# Patient Record
Sex: Male | Born: 1945 | Race: Black or African American | Hispanic: No | Marital: Married | Smoking: Current every day smoker
Health system: Southern US, Community
[De-identification: ages and names within clinical notes are randomized; demographics above are authoritative.]

---

## 2014-07-12 ENCOUNTER — Telehealth: Payer: Self-pay | Admitting: *Deleted

## 2014-07-23 ENCOUNTER — Other Ambulatory Visit (HOSPITAL_COMMUNITY): Payer: Self-pay | Admitting: Internal Medicine

## 2014-07-23 ENCOUNTER — Ambulatory Visit (HOSPITAL_COMMUNITY)
Admission: RE | Admit: 2014-07-23 | Discharge: 2014-07-23 | Disposition: A | Discharge status: Home / Self Care | Payer: Medicare Other | Source: Ambulatory Visit | Attending: Internal Medicine | Admitting: Internal Medicine

## 2014-07-23 DIAGNOSIS — R079 Chest pain, unspecified: Secondary | ICD-10-CM

## 2014-07-23 DIAGNOSIS — R0602 Shortness of breath: Secondary | ICD-10-CM | POA: Diagnosis not present

## 2014-07-23 DIAGNOSIS — R51 Headache: Secondary | ICD-10-CM | POA: Insufficient documentation

## 2014-07-23 DIAGNOSIS — F17210 Nicotine dependence, cigarettes, uncomplicated: Secondary | ICD-10-CM | POA: Insufficient documentation

## 2014-08-02 ENCOUNTER — Ambulatory Visit (HOSPITAL_COMMUNITY)
Admission: RE | Admit: 2014-08-02 | Discharge: 2014-08-02 | Disposition: A | Discharge status: Home / Self Care | Payer: Medicare Other | Source: Ambulatory Visit | Attending: Internal Medicine | Admitting: Internal Medicine

## 2014-08-02 DIAGNOSIS — R079 Chest pain, unspecified: Secondary | ICD-10-CM | POA: Insufficient documentation

## 2014-08-02 DIAGNOSIS — R9431 Abnormal electrocardiogram [ECG] [EKG]: Secondary | ICD-10-CM | POA: Insufficient documentation

## 2014-09-30 NOTE — Telephone Encounter (Signed)
error 

## 2016-03-14 ENCOUNTER — Emergency Department (HOSPITAL_COMMUNITY)
Admission: EM | Admit: 2016-03-14 | Discharge: 2016-03-14 | Disposition: A | Discharge status: Home / Self Care | Payer: No Typology Code available for payment source | Attending: Physician Assistant | Admitting: Physician Assistant

## 2016-03-14 ENCOUNTER — Encounter (HOSPITAL_COMMUNITY): Payer: Self-pay

## 2016-03-14 DIAGNOSIS — M545 Low back pain, unspecified: Secondary | ICD-10-CM

## 2016-03-14 DIAGNOSIS — Y92410 Unspecified street and highway as the place of occurrence of the external cause: Secondary | ICD-10-CM | POA: Insufficient documentation

## 2016-03-14 DIAGNOSIS — M542 Cervicalgia: Secondary | ICD-10-CM | POA: Diagnosis not present

## 2016-03-14 DIAGNOSIS — Y999 Unspecified external cause status: Secondary | ICD-10-CM | POA: Diagnosis not present

## 2016-03-14 DIAGNOSIS — F17210 Nicotine dependence, cigarettes, uncomplicated: Secondary | ICD-10-CM | POA: Insufficient documentation

## 2016-03-14 DIAGNOSIS — Y939 Activity, unspecified: Secondary | ICD-10-CM | POA: Insufficient documentation

## 2016-03-14 DIAGNOSIS — S3992XA Unspecified injury of lower back, initial encounter: Secondary | ICD-10-CM | POA: Insufficient documentation

## 2016-03-14 MED ORDER — METHOCARBAMOL 500 MG PO TABS: 500.0000 mg | ORAL_TABLET | Freq: Two times a day (BID) | ORAL | 0 refills | Status: DC | PRN

## 2016-03-14 MED ORDER — NAPROXEN 250 MG PO TABS: 250.0000 mg | ORAL_TABLET | Freq: Two times a day (BID) | ORAL | 0 refills | Status: DC

## 2016-03-14 NOTE — ED Notes (Signed)
Declined W/C at D/C and was escorted to lobby by RN. 
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
## 2016-04-27 IMAGING — CR DG CHEST 2V
2 series · 2 of 2 positions shown · non-contrast
Comparison: None.

CLINICAL DATA: Shortness of breath in a smoker.

EXAM:
CHEST  2 VIEW

[w chest pa]
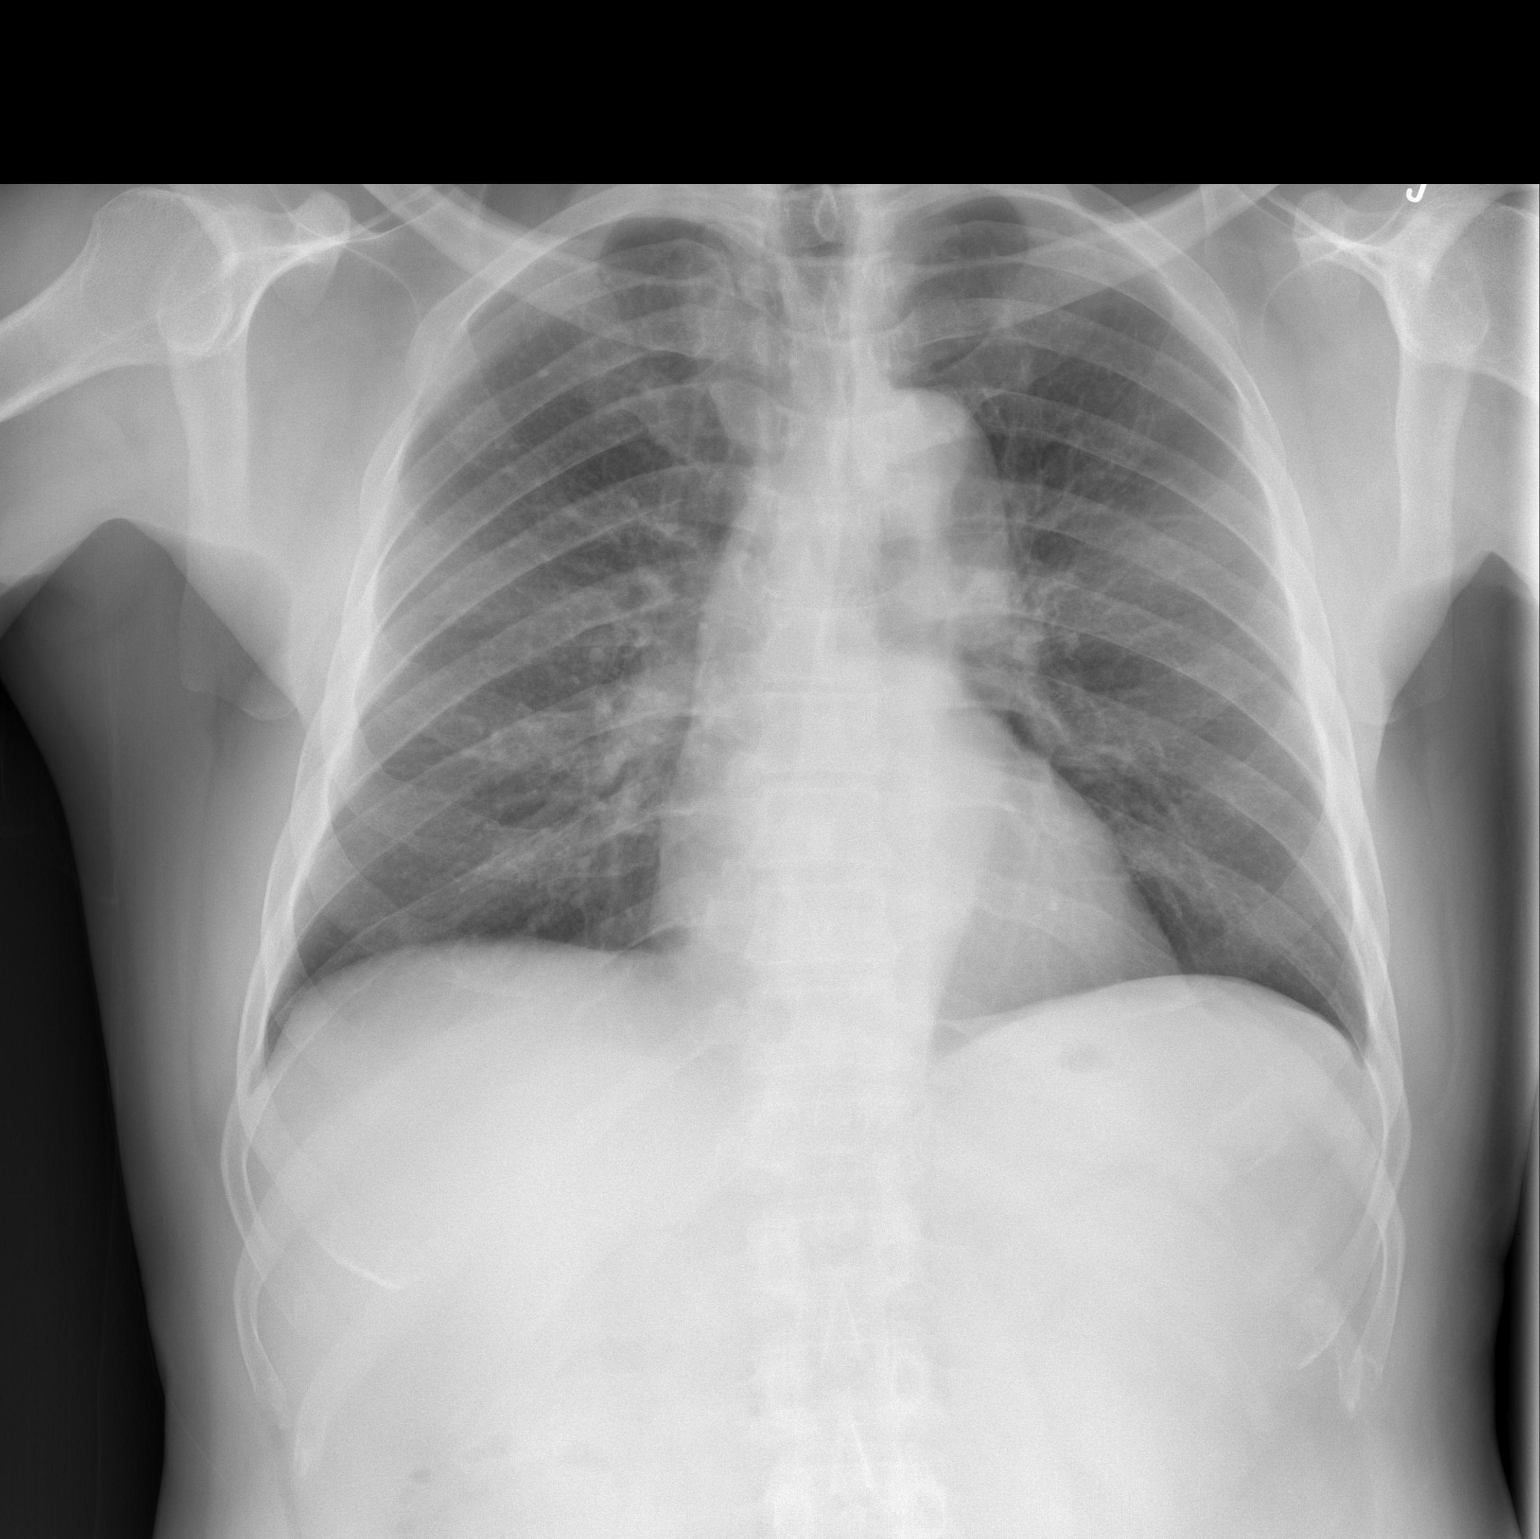

[w chest lat]
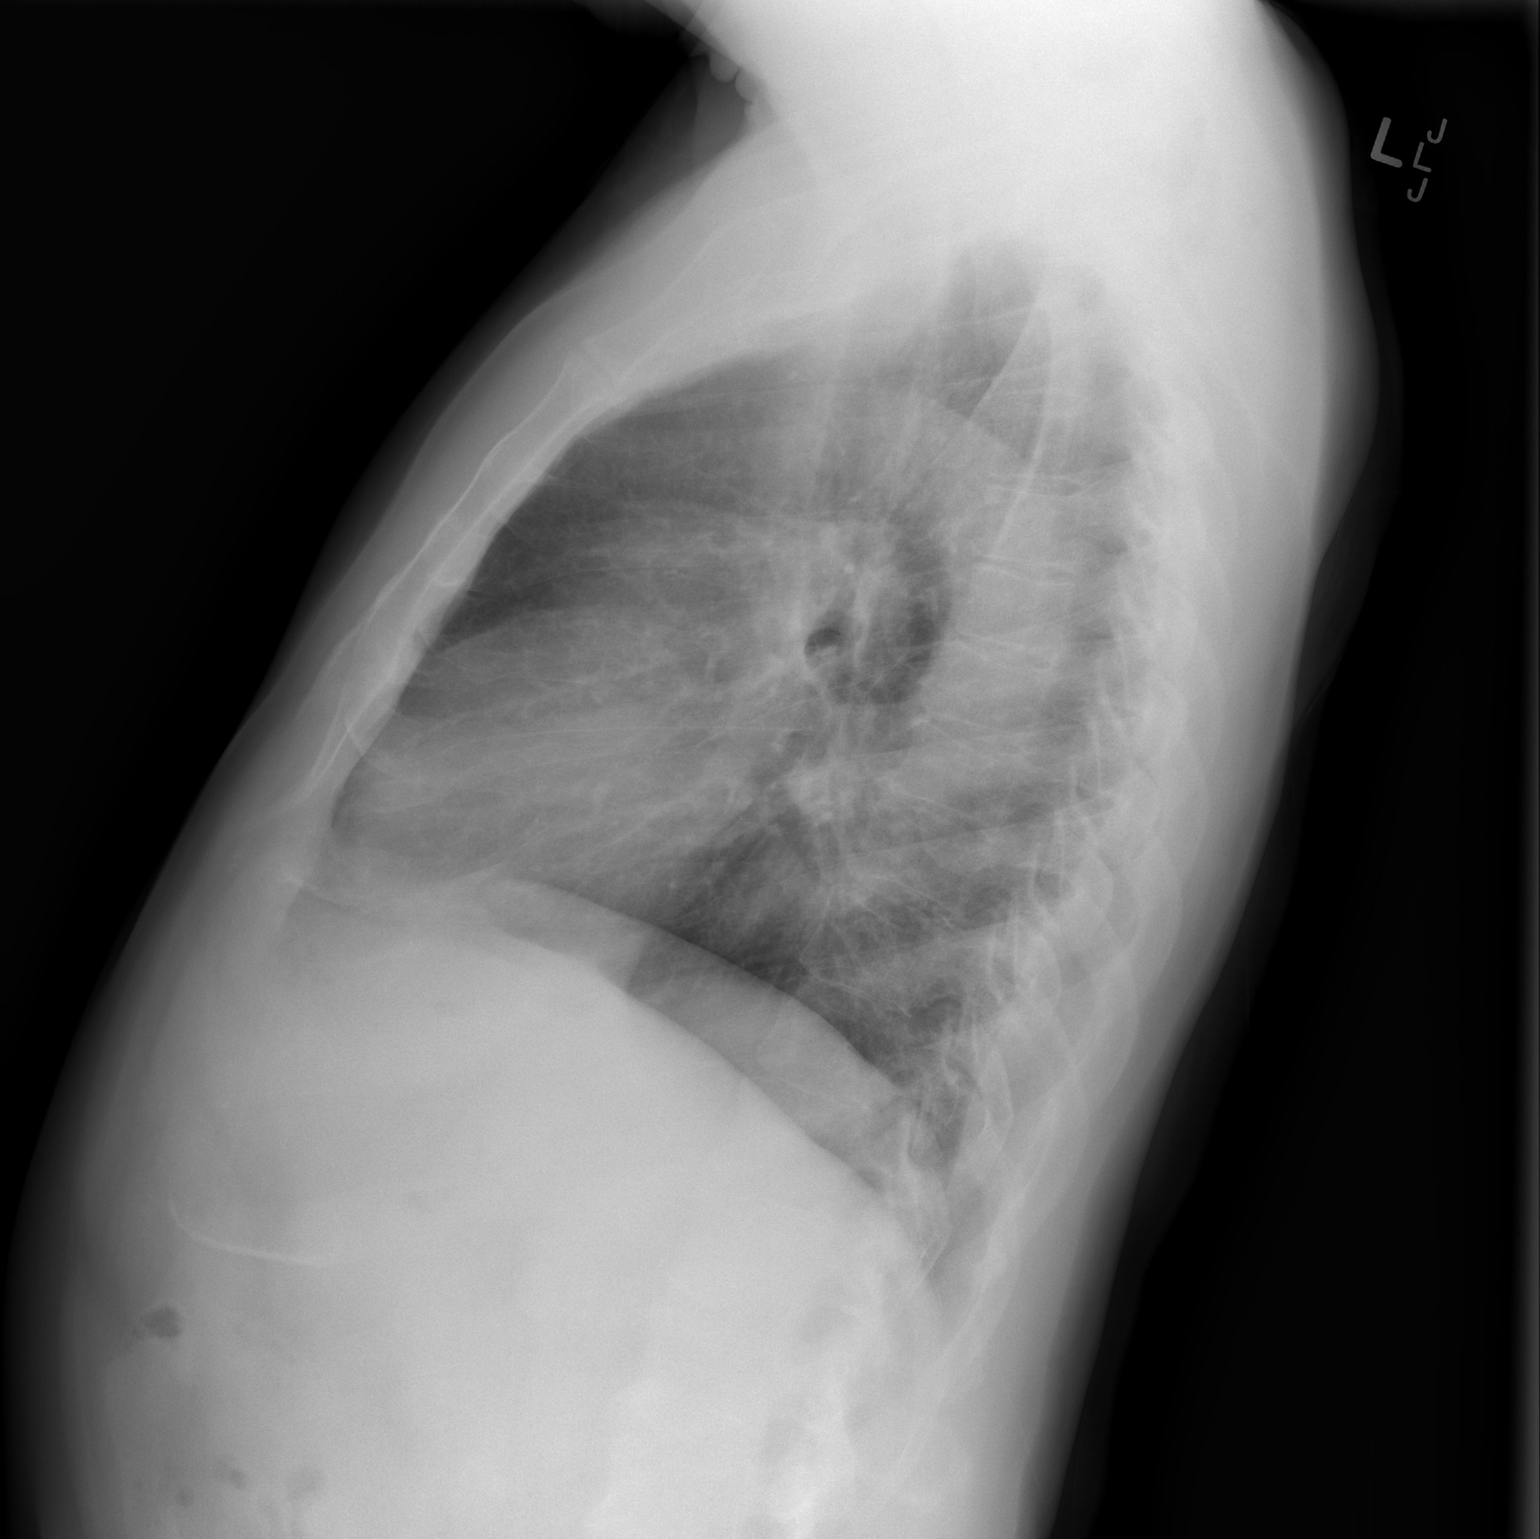

[2 of 2 positions shown; findings below may reference images not displayed]

FINDINGS: Normal heart size with tortuous aorta. Clear lung fields. No
effusion or pneumothorax. Unremarkable osseous structures.
IMPRESSION: No active disease.  BILATERAL 11 headache brighter diet scalp

## 2021-05-22 ENCOUNTER — Emergency Department (HOSPITAL_COMMUNITY)
Admission: EM | Admit: 2021-05-22 | Discharge: 2021-05-22 | Disposition: A | Discharge status: Home / Self Care | Payer: Medicare HMO | Attending: Emergency Medicine | Admitting: Emergency Medicine

## 2021-05-22 ENCOUNTER — Emergency Department (HOSPITAL_COMMUNITY): Payer: Medicare HMO

## 2021-05-22 ENCOUNTER — Other Ambulatory Visit: Payer: Self-pay

## 2021-05-22 ENCOUNTER — Encounter (HOSPITAL_COMMUNITY): Payer: Self-pay

## 2021-05-22 DIAGNOSIS — E876 Hypokalemia: Secondary | ICD-10-CM

## 2021-05-22 DIAGNOSIS — M5136 Other intervertebral disc degeneration, lumbar region: Secondary | ICD-10-CM | POA: Diagnosis not present

## 2021-05-22 DIAGNOSIS — R030 Elevated blood-pressure reading, without diagnosis of hypertension: Secondary | ICD-10-CM | POA: Diagnosis not present

## 2021-05-22 DIAGNOSIS — R200 Anesthesia of skin: Secondary | ICD-10-CM | POA: Diagnosis present

## 2021-05-22 DIAGNOSIS — M51369 Other intervertebral disc degeneration, lumbar region without mention of lumbar back pain or lower extremity pain: Secondary | ICD-10-CM

## 2021-05-22 LAB — CBC
HCT: 41.9 % (ref 39.0–52.0)
Hemoglobin: 13.4 g/dL (ref 13.0–17.0)
MCH: 26.3 pg (ref 26.0–34.0)
MCHC: 32 g/dL (ref 30.0–36.0)
MCV: 82.3 fL (ref 80.0–100.0)
Platelets: 266 10*3/uL (ref 150–400)
RBC: 5.09 MIL/uL (ref 4.22–5.81)
RDW: 15.9 % — ABNORMAL HIGH (ref 11.5–15.5)
WBC: 7.6 10*3/uL (ref 4.0–10.5)
nRBC: 0 % (ref 0.0–0.2)

## 2021-05-22 LAB — BASIC METABOLIC PANEL
Anion gap: 7 (ref 5–15)
BUN: 16 mg/dL (ref 8–23)
CO2: 28 mmol/L (ref 22–32)
Calcium: 9.1 mg/dL (ref 8.9–10.3)
Chloride: 105 mmol/L (ref 98–111)
Creatinine, Ser: 1.35 mg/dL — ABNORMAL HIGH (ref 0.61–1.24)
GFR, Estimated: 55 mL/min — ABNORMAL LOW (ref >60)
Glucose, Bld: 98 mg/dL (ref 70–99)
Potassium: 3.4 mmol/L — ABNORMAL LOW (ref 3.5–5.1)
Sodium: 140 mmol/L (ref 135–145)

## 2021-05-22 MED ORDER — POTASSIUM CHLORIDE CRYS ER 20 MEQ PO TBCR
40.0000 meq | EXTENDED_RELEASE_TABLET | Freq: Once | ORAL | Status: AC
  Administered 2021-05-22: 40 meq via ORAL
  Filled 2021-05-22: qty 2

## 2021-05-22 MED ORDER — PREDNISONE 20 MG PO TABS
60.0000 mg | ORAL_TABLET | Freq: Once | ORAL | Status: AC
  Administered 2021-05-22: 60 mg via ORAL
  Filled 2021-05-22: qty 3

## 2021-05-22 MED ORDER — PREDNISONE 20 MG PO TABS: ORAL_TABLET | ORAL | 0 refills | Status: DC

## 2021-05-22 NOTE — Discharge Instructions (Signed)
It was our pleasure to provide your ER care today - we hope that you feel better.  Take prednisone as prescribed. Take acetaminophen as need.   Follow up with primary care doctor in the coming week - discuss current symptoms, and follow up plan, also have your blood pressure rechecked as it is high today. Your back imaging/ct scan shows degenerative disc changes in your lower back - follow up with back/spine specialist in the next 1-2 weeks  - call office to arrange appointment.   From today's labs, your potassium level is slightly low  - eat plenty of fruits and vegetables, and follow up with your doctor.   Return to ER if worse, new symptoms, new/severe pain, weakness, loss of normal urine or bowel function, or other concern.

## 2021-05-22 NOTE — ED Provider Notes (Signed)
[facility] EMERGENCY DEPARTMENT Provider Note   CSN: [id number] Arrival date & time: 05/22/21  1709     History  Chief Complaint  Patient presents with   Leg Tingling    Kenneth Benitez is a 76 y.o. male.  Patient c/o bilateral leg 'numbness/tingling' sensation in past 3-4 weeks. Symptoms gradual onset, constant, persistent, no acute or abrupt worsening today. Went to pcp office today and they sent to ED via EMS - pt indicates walked into office just fine, denies weakness. No arm numbness/tingling. Denies trauma/fall. Denies hx ddd. No radicular pain. States some recent light sensitivity. Denies headache. No change in speech or vision. No loss of normal functional ability. No problems w balance/walking. No problems with normal bowel and bladder function. No saddle area numbness.   The history is provided by the patient, medical records and the EMS personnel.      Home Medications Prior to Admission medications   Medication Sig Start Date End Date Taking? Authorizing Provider  methocarbamol (ROBAXIN) 500 MG tablet Take 1 tablet (500 mg total) by mouth 2 (two) times daily as needed for muscle spasms. 03/14/16   Waynetta Pean, PA-C  naproxen (NAPROSYN) 250 MG tablet Take 1 tablet (250 mg total) by mouth 2 (two) times daily with a meal. 03/14/16   Waynetta Pean, PA-C      Allergies    Patient has no known allergies.    Review of Systems   Review of Systems  Constitutional:  Negative for chills and fever.  HENT:  Negative for sore throat.   Eyes:  Negative for pain, redness and visual disturbance.  Respiratory:  Negative for cough and shortness of breath.   Cardiovascular:  Negative for chest pain.  Gastrointestinal:  Negative for abdominal pain, diarrhea and vomiting.  Genitourinary:  Negative for difficulty urinating, dysuria and flank pain.  Musculoskeletal:  Negative for back pain and neck pain.  Skin:  Negative for rash.  Neurological:  Positive for  numbness. Negative for weakness and headaches.  Hematological:  Does not bruise/bleed easily.  Psychiatric/Behavioral:  Negative for confusion.    Physical Exam Updated Vital Signs Pulse 76    Temp 98.1 F (36.7 C) (Oral)    Resp (!) 21    SpO2 99%  Physical Exam Vitals and nursing note reviewed.  Constitutional:      Appearance: Normal appearance. He is well-developed.  HENT:     Head: Atraumatic.     Nose: Nose normal.     Mouth/Throat:     Mouth: Mucous membranes are moist.     Pharynx: Oropharynx is clear.  Eyes:     General: No scleral icterus.    Conjunctiva/sclera: Conjunctivae normal.     Pupils: Pupils are equal, round, and reactive to light.  Neck:     Vascular: No carotid bruit.     Trachea: No tracheal deviation.  Cardiovascular:     Rate and Rhythm: Normal rate and regular rhythm.     Pulses: Normal pulses.     Heart sounds: Normal heart sounds. No murmur heard.   No friction rub. No gallop.  Pulmonary:     Effort: Pulmonary effort is normal. No accessory muscle usage or respiratory distress.     Breath sounds: Normal breath sounds.  Abdominal:     General: Bowel sounds are normal. There is no distension.     Palpations: Abdomen is soft.     Tenderness: There is no abdominal tenderness. There is no guarding.  Genitourinary:    Comments: No cva tenderness. Musculoskeletal:        General: No swelling.     Cervical back: Normal range of motion and neck supple. No rigidity.     Right lower leg: No edema.     Left lower leg: No edema.     Comments: CTLS spine, non tender, aligned, no step off. No lower extremity swelling. Distal pulses palp bil.   Skin:    General: Skin is warm and dry.     Findings: No rash.  Neurological:     Mental Status: He is alert.     Comments: Alert, speech clear.   Psychiatric:        Mood and Affect: Mood normal.    ED Results / Procedures / Treatments   Labs (all labs ordered are listed, but only abnormal results are  displayed) Results for orders placed or performed during the hospital encounter of [id number]  Basic metabolic panel  Result Value Ref Range   Sodium 140 135 - 145 mmol/L   Potassium 3.4 (L) 3.5 - 5.1 mmol/L   Chloride 105 98 - 111 mmol/L   CO2 28 22 - 32 mmol/L   Glucose, Bld 98 70 - 99 mg/dL   BUN 16 8 - 23 mg/dL   Creatinine, Ser 1.35 (H) 0.61 - 1.24 mg/dL   Calcium 9.1 8.9 - 10.3 mg/dL   GFR, Estimated 55 (L) >60 mL/min   Anion gap 7 5 - 15  CBC  Result Value Ref Range   WBC 7.6 4.0 - 10.5 K/uL   RBC 5.09 4.22 - 5.81 MIL/uL   Hemoglobin 13.4 13.0 - 17.0 g/dL   HCT 41.9 39.0 - 52.0 %   MCV 82.3 80.0 - 100.0 fL   MCH 26.3 26.0 - 34.0 pg   MCHC 32.0 30.0 - 36.0 g/dL   RDW 15.9 (H) 11.5 - 15.5 %   Platelets 266 150 - 400 K/uL   nRBC 0.0 0.0 - 0.2 %   No results found.   EKG None  Radiology CT Head Wo Contrast  Result Date: 05/22/2021 CLINICAL DATA:  Paresthesias, tingling and numbness EXAM: CT HEAD WITHOUT CONTRAST TECHNIQUE: Contiguous axial images were obtained from the base of the skull through the vertex without intravenous contrast. RADIATION DOSE REDUCTION: This exam was performed according to the departmental dose-optimization program which includes automated exposure control, adjustment of the mA and/or kV according to patient size and/or use of iterative reconstruction technique. COMPARISON:  None. FINDINGS: Brain: No acute intracranial findings are seen. There are no signs of bleeding or any focal mass effect. Cortical sulci are prominent. There is decreased density in the periventricular and subcortical white matter. Vascular: Unremarkable. Skull: No fracture is seen. Sinuses/Orbits: There is opacification of left maxillary sinus. There is medial bulging of medial wall of left maxillary sinus. Other: None IMPRESSION: No acute intracranial findings are seen in noncontrast CT brain. Atrophy. Small-vessel disease. There is opacification of left maxillary sinus suggesting  chronic sinusitis. There is medial bulging of medial wall of left maxillary sinus which may be residual from previous trauma or suggest mucocele. Electronically Signed   By: Elmer Picker M.D.   On: 05/22/2021 18:34   CT Lumbar Spine Wo Contrast  Result Date: 05/22/2021 CLINICAL DATA:  Lumbar radiculopathy. Previous surgery. New symptoms. Tingling and numbness both legs over the last 3 weeks. EXAM: CT LUMBAR SPINE WITHOUT CONTRAST TECHNIQUE: Multidetector CT imaging of the lumbar spine was performed without intravenous contrast  administration. Multiplanar CT image reconstructions were also generated. RADIATION DOSE REDUCTION: This exam was performed according to the departmental dose-optimization program which includes automated exposure control, adjustment of the mA and/or kV according to patient size and/or use of iterative reconstruction technique. COMPARISON:  None. FINDINGS: Segmentation: 5 lumbar type vertebral bodies. Alignment: No malalignment. Straightening of the normal cervical lordosis. Vertebrae: No fracture or focal bone lesion. Incidental endplate Schmorl's nodes. Paraspinal and other soft tissues: Small hiatal hernia. Aortic atherosclerotic calcification. Disc levels: Mild non-compressive disc bulges from T10-11 through L3-4. L4-5: Moderate bulging of the disc. Facet and ligamentous hypertrophy. Stenosis of both lateral recesses that could possibly cause neural compression. L5-S1: Bulging of the disc with encroachment upon both neural foramina. Mild facet degeneration and hypertrophy. No central canal stenosis. Either L5 nerve could be affected, more likely the left. IMPRESSION: L4-5: Disc bulge. Facet and ligamentous hypertrophy. Narrowing of both subarticular lateral recesses that could possibly cause neural compression. L5-S1: Disc bulge and endplate osteophytes. Bilateral foraminal encroachment left more than right. Either L5 nerve could be affected, more likely the left. Electronically  Signed   By: Nelson Chimes M.D.   On: 05/22/2021 18:34    Procedures Procedures    Medications Ordered in ED Medications - No data to display  ED Course/ Medical Decision Making/ A&P                           Medical Decision Making Problems Addressed: Bilateral leg numbness: chronic illness or injury Elevated blood pressure reading: acute illness or injury Hypokalemia: acute illness or injury Lumbar degenerative disc disease: chronic illness or injury with exacerbation, progression, or side effects of treatment  Amount and/or Complexity of Data Reviewed Independent Historian:     Details: clinic notes from today External Data Reviewed: ECG and notes. Labs: ordered. Decision-making details documented in ED Course. Radiology: ordered and independent interpretation performed. Decision-making details documented in ED Course.  Risk Prescription drug management.   Imaging ordered.  Reviewed nursing notes and prior charts for additional history. External reports reviewed.   Labs reviewed/interpreted by me - k sl low. Kcl po.  CT reviewed/interpreted by me - ddd, no critical/cord compression.   Pt denies any weakness, no gi or gu c/o. Symptoms ongoing for several weeks without abrupt worsening.   Will try pred taper, rec pcp and outpatient spine f/u.  Return precautions provided.             Final Clinical Impression(s) / ED Diagnoses Final diagnoses:  None    Rx / DC Orders ED Discharge Orders     None         Lajean Saver, MD 05/22/21 2043

## 2021-05-22 NOTE — ED Notes (Signed)
Got patient undressed on the monitor did ekg shown to er provider patient is resitng with call bell in reach

## 2021-05-22 NOTE — ED Notes (Signed)
Discharge instructions reviewed with patient. Patient verbalized understanding of instructions. Follow-up care and medications were reviewed. Patient ambulatory with steady gait. VSS upon discharge.  ?

## 2021-05-22 NOTE — ED Triage Notes (Signed)
Pt arrived via EMS from PCP. Pt has had bilateral leg tingling x 3 weeks w/ a heaviness feeling in them. Pt also c/o lights at night being bright. VSS w/ EMS.

## 2021-12-18 ENCOUNTER — Emergency Department (HOSPITAL_COMMUNITY): Payer: Medicare Other

## 2021-12-18 ENCOUNTER — Inpatient Hospital Stay (HOSPITAL_COMMUNITY)
Admission: EM | Admit: 2021-12-18 | Discharge: 2021-12-22 | DRG: 177 | Disposition: A | Discharge status: Skilled Nursing Facility | Payer: Medicare Other | Attending: Internal Medicine | Admitting: Internal Medicine

## 2021-12-18 ENCOUNTER — Encounter (HOSPITAL_COMMUNITY): Payer: Self-pay | Admitting: Emergency Medicine

## 2021-12-18 DIAGNOSIS — Z20822 Contact with and (suspected) exposure to covid-19: Secondary | ICD-10-CM | POA: Diagnosis present

## 2021-12-18 DIAGNOSIS — J189 Pneumonia, unspecified organism: Secondary | ICD-10-CM | POA: Diagnosis present

## 2021-12-18 DIAGNOSIS — R600 Localized edema: Secondary | ICD-10-CM | POA: Diagnosis present

## 2021-12-18 DIAGNOSIS — E440 Moderate protein-calorie malnutrition: Secondary | ICD-10-CM | POA: Diagnosis present

## 2021-12-18 DIAGNOSIS — E876 Hypokalemia: Secondary | ICD-10-CM | POA: Diagnosis present

## 2021-12-18 DIAGNOSIS — I129 Hypertensive chronic kidney disease with stage 1 through stage 4 chronic kidney disease, or unspecified chronic kidney disease: Secondary | ICD-10-CM | POA: Diagnosis present

## 2021-12-18 DIAGNOSIS — R4189 Other symptoms and signs involving cognitive functions and awareness: Secondary | ICD-10-CM | POA: Diagnosis present

## 2021-12-18 DIAGNOSIS — Z87891 Personal history of nicotine dependence: Secondary | ICD-10-CM

## 2021-12-18 DIAGNOSIS — F321 Major depressive disorder, single episode, moderate: Secondary | ICD-10-CM | POA: Diagnosis present

## 2021-12-18 DIAGNOSIS — G929 Unspecified toxic encephalopathy: Secondary | ICD-10-CM | POA: Diagnosis present

## 2021-12-18 DIAGNOSIS — I161 Hypertensive emergency: Secondary | ICD-10-CM | POA: Diagnosis present

## 2021-12-18 DIAGNOSIS — Z79899 Other long term (current) drug therapy: Secondary | ICD-10-CM

## 2021-12-18 DIAGNOSIS — I10 Essential (primary) hypertension: Secondary | ICD-10-CM | POA: Diagnosis not present

## 2021-12-18 DIAGNOSIS — I160 Hypertensive urgency: Secondary | ICD-10-CM | POA: Diagnosis present

## 2021-12-18 DIAGNOSIS — J690 Pneumonitis due to inhalation of food and vomit: Secondary | ICD-10-CM | POA: Diagnosis not present

## 2021-12-18 DIAGNOSIS — I5031 Acute diastolic (congestive) heart failure: Secondary | ICD-10-CM | POA: Diagnosis not present

## 2021-12-18 DIAGNOSIS — Z6825 Body mass index (BMI) 25.0-25.9, adult: Secondary | ICD-10-CM

## 2021-12-18 DIAGNOSIS — R627 Adult failure to thrive: Secondary | ICD-10-CM | POA: Diagnosis present

## 2021-12-18 DIAGNOSIS — R4182 Altered mental status, unspecified: Principal | ICD-10-CM

## 2021-12-18 DIAGNOSIS — R5381 Other malaise: Secondary | ICD-10-CM | POA: Diagnosis not present

## 2021-12-18 DIAGNOSIS — R9431 Abnormal electrocardiogram [ECG] [EKG]: Secondary | ICD-10-CM | POA: Diagnosis not present

## 2021-12-18 DIAGNOSIS — N1831 Chronic kidney disease, stage 3a: Secondary | ICD-10-CM | POA: Diagnosis present

## 2021-12-18 LAB — URINALYSIS, ROUTINE W REFLEX MICROSCOPIC
Bilirubin Urine: NEGATIVE
Glucose, UA: NEGATIVE mg/dL
Hgb urine dipstick: NEGATIVE
Ketones, ur: 5 mg/dL — AB
Nitrite: NEGATIVE
Protein, ur: 30 mg/dL — AB
Specific Gravity, Urine: 1.024 (ref 1.005–1.030)
pH: 5 (ref 5.0–8.0)

## 2021-12-18 LAB — CBC WITH DIFFERENTIAL/PLATELET
Abs Immature Granulocytes: 0.06 10*3/uL (ref 0.00–0.07)
Basophils Absolute: 0.1 10*3/uL (ref 0.0–0.1)
Basophils Relative: 0 %
Eosinophils Absolute: 0.4 10*3/uL (ref 0.0–0.5)
Eosinophils Relative: 3 %
HCT: 43.2 % (ref 39.0–52.0)
Hemoglobin: 13.7 g/dL (ref 13.0–17.0)
Immature Granulocytes: 1 %
Lymphocytes Relative: 16 %
Lymphs Abs: 2.1 10*3/uL (ref 0.7–4.0)
MCH: 26.2 pg (ref 26.0–34.0)
MCHC: 31.7 g/dL (ref 30.0–36.0)
MCV: 82.6 fL (ref 80.0–100.0)
Monocytes Absolute: 1.1 10*3/uL — ABNORMAL HIGH (ref 0.1–1.0)
Monocytes Relative: 9 %
Neutro Abs: 9.2 10*3/uL — ABNORMAL HIGH (ref 1.7–7.7)
Neutrophils Relative %: 71 %
Platelets: 246 10*3/uL (ref 150–400)
RBC: 5.23 MIL/uL (ref 4.22–5.81)
RDW: 16.1 % — ABNORMAL HIGH (ref 11.5–15.5)
WBC: 12.9 10*3/uL — ABNORMAL HIGH (ref 4.0–10.5)
nRBC: 0 % (ref 0.0–0.2)

## 2021-12-18 LAB — RESP PANEL BY RT-PCR (FLU A&B, COVID) ARPGX2
Influenza A by PCR: NEGATIVE
Influenza B by PCR: NEGATIVE
SARS Coronavirus 2 by RT PCR: NEGATIVE

## 2021-12-18 LAB — COMPREHENSIVE METABOLIC PANEL
ALT: 23 U/L (ref 0–44)
AST: 24 U/L (ref 15–41)
Albumin: 4.1 g/dL (ref 3.5–5.0)
Alkaline Phosphatase: 78 U/L (ref 38–126)
Anion gap: 11 (ref 5–15)
BUN: 12 mg/dL (ref 8–23)
CO2: 27 mmol/L (ref 22–32)
Calcium: 9.9 mg/dL (ref 8.9–10.3)
Chloride: 105 mmol/L (ref 98–111)
Creatinine, Ser: 1.42 mg/dL — ABNORMAL HIGH (ref 0.61–1.24)
GFR, Estimated: 52 mL/min — ABNORMAL LOW (ref >60)
Glucose, Bld: 79 mg/dL (ref 70–99)
Potassium: 3 mmol/L — ABNORMAL LOW (ref 3.5–5.1)
Sodium: 143 mmol/L (ref 135–145)
Total Bilirubin: 0.4 mg/dL (ref 0.3–1.2)
Total Protein: 7.9 g/dL (ref 6.5–8.1)

## 2021-12-18 LAB — MAGNESIUM: Magnesium: 2 mg/dL (ref 1.7–2.4)

## 2021-12-18 LAB — TROPONIN I (HIGH SENSITIVITY)
Troponin I (High Sensitivity): 8 ng/L (ref <18)
Troponin I (High Sensitivity): 8 ng/L (ref <18)

## 2021-12-18 LAB — LACTIC ACID, PLASMA: Lactic Acid, Venous: 1.2 mmol/L (ref 0.5–1.9)

## 2021-12-18 LAB — CBG MONITORING, ED: Glucose-Capillary: 78 mg/dL (ref 70–99)

## 2021-12-18 LAB — AMMONIA: Ammonia: 19 umol/L (ref 9–35)

## 2021-12-18 LAB — BRAIN NATRIURETIC PEPTIDE: B Natriuretic Peptide: 48.1 pg/mL (ref 0.0–100.0)

## 2021-12-18 MED ORDER — IOHEXOL 350 MG/ML SOLN
100.0000 mL | Freq: Once | INTRAVENOUS | Status: AC | PRN
  Administered 2021-12-18: 100 mL via INTRAVENOUS

## 2021-12-18 MED ORDER — HYDRALAZINE HCL 20 MG/ML IJ SOLN: 5.0000 mg | Freq: Four times a day (QID) | INTRAMUSCULAR | Status: DC | PRN

## 2021-12-18 MED ORDER — SODIUM CHLORIDE 0.9 % IV SOLN
3.0000 g | Freq: Four times a day (QID) | INTRAVENOUS | Status: DC
  Administered 2021-12-18 – 2021-12-22 (×16): 3 g via INTRAVENOUS
  Filled 2021-12-18 (×14): qty 8

## 2021-12-18 MED ORDER — POLYETHYLENE GLYCOL 3350 17 G PO PACK: 17.0000 g | PACK | Freq: Every day | ORAL | Status: DC | PRN

## 2021-12-18 MED ORDER — AMLODIPINE BESYLATE 5 MG PO TABS
5.0000 mg | ORAL_TABLET | Freq: Every day | ORAL | Status: DC
Start: 2021-12-18 — End: 2021-12-22
  Administered 2021-12-18 – 2021-12-22 (×5): 5 mg via ORAL
  Filled 2021-12-18 (×5): qty 1

## 2021-12-18 MED ORDER — SODIUM CHLORIDE 0.9 % IV SOLN
3.0000 g | Freq: Once | INTRAVENOUS | Status: DC
  Filled 2021-12-18: qty 8

## 2021-12-18 MED ORDER — ONDANSETRON HCL 4 MG/2ML IJ SOLN: 4.0000 mg | Freq: Four times a day (QID) | INTRAMUSCULAR | Status: DC | PRN

## 2021-12-18 MED ORDER — POTASSIUM CHLORIDE 10 MEQ/100ML IV SOLN
10.0000 meq | INTRAVENOUS | Status: AC
  Administered 2021-12-18 – 2021-12-19 (×3): 10 meq via INTRAVENOUS
  Filled 2021-12-18 (×3): qty 100

## 2021-12-18 MED ORDER — ENOXAPARIN SODIUM 40 MG/0.4ML IJ SOSY
40.0000 mg | PREFILLED_SYRINGE | INTRAMUSCULAR | Status: DC
  Administered 2021-12-18 – 2021-12-21 (×4): 40 mg via SUBCUTANEOUS
  Filled 2021-12-18 (×4): qty 0.4

## 2021-12-18 MED ORDER — ACETAMINOPHEN 325 MG PO TABS: 650.0000 mg | ORAL_TABLET | Freq: Four times a day (QID) | ORAL | Status: DC | PRN

## 2021-12-18 NOTE — ED Provider Notes (Signed)
Shared resident visit, history, physical, medical decision performed by myself.  Patient here with altered mental status.  Over the last several weeks patient with increasing confusion, failure to thrive, now unable to walk under his own power.  He has been having significant leg swelling the last week or so as well.  Lives at home with a family member who is unable to safely take care of him.  He has been having elevated blood pressures here recently and saw a doctor at the [place] but they have not started any medications for this.  He is not taking any medications.  There has not been any fever but there has been a cough.  Patient answers questions well but he is possibly mildly encephalopathic.  He is got pitting edema bilaterally in his legs.  But he does not show any signs of respiratory distress or hypoxia.  This could be venous stasis versus volume overloaded.  Differential is wide including press, infectious process, stroke, electrolyte abnormality.  Blood work including CBC, CMP, viral panel, magnesium, Trope, BNP, chest x-ray, ammonia, urinalysis, lactic acid has been ordered.  He is not febrile and no concern for sepsis and can hold off on broad-spectrum IV antibiotics and blood cultures at this time.  We will get head CT.  Per my review and interpretation of labs thus far he has a white count of 12.9.  Chest x-ray concerning for pneumonia per radiology report.  Creatinine appears to be at baseline at 1.42.  Potassium is 3.0.  We will add CT scan of the chest given abnormal chest x-ray to further evaluate for infectious process.  CT report per radiology appears consistent with pneumonia likely aspiration.  Given his mild encephalopathy with this and overall failure to thrive will admit for IV antibiotics and likely supportive care may be placement.  Will admit to medicine.  This chart was dictated using voice recognition software.  Despite best efforts to proofread,  errors can occur which can change  the documentation meaning.    Virgina Norfolk, DO 12/18/21 1929

## 2021-12-18 NOTE — ED Provider Notes (Signed)
[facility] EMERGENCY DEPARTMENT Provider Note   CSN: [id number] Arrival date & time: 12/18/21  1136     History  Chief Complaint  Patient presents with   Altered Mental Status    WARDEN BUFFA is a 76 y.o. male with Hx of HTN and chronic daily tobacco use.  Presenting today due to gradually worsening mental status over the last several months, with significant decrease within the last few weeks.  Patient's sister states he is no longer able to care for himself.  She continues that he seems to lose thought during the middle of his answers, or answers different questions and than have been asked.  Also has developed a cough and new lower extremity swelling within the last 3 to 4 days.  Cough is nonproductive, causes him increasing fatigue.  Also gradually unable to walk well without assistance, as a few months ago he was able to walk without issue.  Denies fevers, sore throat, chest pain, N/V/D, constipation, abdominal pain, changes in bowel habits, neck stiffness, vision changes.  Used to be on hypertensive medications a few years ago, however has stopped taking these.  Seen at the [facility] earlier today with a chest x-ray done, then was taken here to [facility] ED due to the PA's slow progress and updates, per patient's sister.  The history is provided by the patient and medical records.  Altered Mental Status Associated symptoms: weakness        Home Medications Prior to Admission medications   Medication Sig Start Date End Date Taking? Authorizing Provider  methocarbamol (ROBAXIN) 500 MG tablet Take 1 tablet (500 mg total) by mouth 2 (two) times daily as needed for muscle spasms. 03/14/16   Everlene Farrier, PA-C  naproxen (NAPROSYN) 250 MG tablet Take 1 tablet (250 mg total) by mouth 2 (two) times daily with a meal. 03/14/16   Everlene Farrier, PA-C  predniSONE (DELTASONE) 20 MG tablet 3 po once a day for 2 days, then 2 po once a day for 3 days, then 1 po once  a day for 3 days 05/23/21   Cathren Laine, MD      Allergies    Patient has no known allergies.    Review of Systems   Review of Systems  Respiratory:  Positive for cough.   Neurological:  Positive for weakness.    Physical Exam Updated Vital Signs BP (!) 165/105   Pulse 81   Temp 98.3 F (36.8 C) (Oral)   Resp 16   SpO2 97%  Physical Exam Vitals and nursing note reviewed.  Constitutional:      General: He is not in acute distress.    Appearance: He is well-developed. He is ill-appearing. He is not toxic-appearing or diaphoretic.  HENT:     Head: Normocephalic and atraumatic.     Mouth/Throat:     Pharynx: Oropharynx is clear. No oropharyngeal exudate or posterior oropharyngeal erythema.  Eyes:     Conjunctiva/sclera: Conjunctivae normal.  Neck:     Comments: No meningismus or torticollis Cardiovascular:     Rate and Rhythm: Normal rate and regular rhythm.     Pulses: Normal pulses.     Heart sounds: No murmur heard.    Comments: 1+ pulses of DP and PT, 2+ radial Pulmonary:     Effort: Pulmonary effort is normal. No respiratory distress.     Breath sounds: No stridor. Wheezing present.  Chest:     Chest wall: No tenderness.  Abdominal:  General: There is no distension.     Palpations: Abdomen is soft.     Tenderness: There is no abdominal tenderness. There is no right CVA tenderness, left CVA tenderness or guarding.  Musculoskeletal:        General: No swelling.     Cervical back: Neck supple. No rigidity.     Right lower leg: Edema present.     Left lower leg: Edema present.     Comments: 2+ to 3+ pitting edema of lower extremities to just above the knee.  Sensation and blood flow appear grossly intact.  Skin:    General: Skin is warm and dry.     Capillary Refill: Capillary refill takes less than 2 seconds.     Coloration: Skin is not jaundiced.  Neurological:     Mental Status: He is alert and oriented to person, place, and time.  Psychiatric:         Mood and Affect: Mood normal.     ED Results / Procedures / Treatments   Labs (all labs ordered are listed, but only abnormal results are displayed) Labs Reviewed  COMPREHENSIVE METABOLIC PANEL - Abnormal; Notable for the following components:      Result Value   Potassium 3.0 (*)    Creatinine, Ser 1.42 (*)    GFR, Estimated 52 (*)    All other components within normal limits  CBC WITH DIFFERENTIAL/PLATELET - Abnormal; Notable for the following components:   WBC 12.9 (*)    RDW 16.1 (*)    Neutro Abs 9.2 (*)    Monocytes Absolute 1.1 (*)    All other components within normal limits  RESP PANEL BY RT-PCR (FLU A&B, COVID) ARPGX2  URINE CULTURE  MAGNESIUM  URINALYSIS, ROUTINE W REFLEX MICROSCOPIC  BRAIN NATRIURETIC PEPTIDE  AMMONIA  LACTIC ACID, PLASMA  LACTIC ACID, PLASMA  CBG MONITORING, ED  TROPONIN I (HIGH SENSITIVITY)    EKG EKG Interpretation  Date/Time:  Friday December 18 2021 12:12:30 EDT Ventricular Rate:  84 PR Interval:  152 QRS Duration: 78 QT Interval:  346 QTC Calculation: 408 R Axis:   58 Text Interpretation: Sinus rhythm with Premature supraventricular complexes and with occasional Premature ventricular complexes Nonspecific ST and T wave abnormality Abnormal ECG When compared with ECG of 22-May-2021 17:16, PREVIOUS ECG IS PRESENT Confirmed by Virgina Norfolk ([id number]) on 12/18/2021 5:40:12 PM  Radiology DG Chest 2 View  Result Date: 12/18/2021 CLINICAL DATA:  Cough EXAM: CHEST - 2 VIEW COMPARISON:  07/23/2014 FINDINGS: Cardiac size is within normal limits. There are no signs of alveolar pulmonary edema. There is prominence of right hilum. Increased markings are seen in medial right lower lung field. Rest of the lung fields are clear. Left hemidiaphragm is slightly elevated. There is no pleural effusion or pneumothorax. IMPRESSION: Increased markings are seen in medial right lower lung fields suggesting atelectasis/pneumonia. Right hilum is more prominent on the  left which may be due to adjacent infiltrate or enlarged lymph nodes. Short-term follow-up chest radiographs along with CT if warranted should be considered to rule out any underlying neoplastic process. Electronically Signed   By: Ernie Avena M.D.   On: 12/18/2021 13:24    Procedures Procedures    Medications Ordered in ED Medications  iohexol (OMNIPAQUE) 350 MG/ML injection 100 mL (100 mLs Intravenous Contrast Given 12/18/21 1840)    ED Course/ Medical Decision Making/ A&P  Medical Decision Making Amount and/or Complexity of Data Reviewed Labs: ordered. Radiology: ordered.  Risk Prescription drug management.   76 y.o. male presents to the ED for concern of Altered Mental Status     This involves an extensive number of treatment options, and is a complaint that carries with it a high risk of complications and morbidity.  The emergent differential diagnosis prior to evaluation includes, but is not limited to: Press syndrome, pneumonia, failure to thrive, hypertensive emergency, new onset dementia  This is not an exhaustive differential.   Past Medical History / Co-morbidities / Social History: Hx of HTN and chronic daily tobacco use Social Determinants of Health include: Elderly, decreased independence and able to take care of oneself  Additional History:  Obtained by chart review.  Notably Hx of HTN  Lab Tests: I ordered, and personally interpreted labs.  The pertinent results include:   Magnesium 2.0 WBC 12.9 with leukocytosis Potassium 3.0 Creatinine 1.42, BUN 12, GFR 52, appears mildly stable CBG: 78 Covid and flu negative  Imaging Studies: I ordered imaging studies including CXR, CT head and CT chest.   I independently visualized and interpreted imaging which showed CXR: possible pneumonia vs atelectasis vs mass CT head and neck: Pending I agree with the radiologist interpretation.  Cardiac Monitoring: The patient was maintained  on a cardiac monitor.  I personally viewed and interpreted the cardiac monitored which showed an underlying rhythm of: NSR  ED Course / Critical Interventions: Pt ill-appearing on exam.  Presenting with worsening cough, new lower extremity swelling, hypertensive emergency, and increased failure to thrive over the last few weeks due to unknown cause.  Afebrile without fevers over same period of time.  However decreased ability to walk, specifically over the last 3 weeks.  Increased slow speech and response time, also appears to answer different questions than have been asked at times.  Forgetfulness per FM, and with 3-4 days of cough and lower extremity swelling.  Swelling significant nonpitting 3+, however sensation and blood flow appears grossly intact.  Makes appropriate eye contact.  Without obvious neurodeficits on exam as described above.  Without seizure-like activity.  Labs not indicative of hyponatremia or thrombocytopenia.  Magnesium stable, with mild hypokalemia.  Covid and flu negative.  Noticeably hypertensive at 200/100.  Satting at 99-98, without increased respiratory effort.  Suspect symptomatic hypertension, such as possible PRESS syndrome.  Without significant alcohol history, though with daily tobacco use.  EKG nonspecific.  Clinical picture not suggestive of Wernicke's encephalopathy, alcohol withdrawal, serotonin syndrome, NMS, hypoglycemia, hyperglycemia, renal failure, or obvious sepsis at this time.  Plan for head CT to assess for encephalopathy, CVA, hemorrhage, or infectious process.  May consider brain MRI.  CXR suggestive of pneumonia vs atelectasis.  Concerned for possible PRESS syndrome.  Plan to further evaluate via CT chest.  Differential still broad.   Anticipate admission  Disposition: 1900 care of JAVARIS WIGINGTON transferred to Dr. Lockie Mola at the end of my shift as the patient will require reassessment once labs/imaging have resulted.  Attending is up-to-date with case  and has shared with the treatment course thus far.  Please see his/her note for further details regarding further ED course and disposition.  Plan at time of handoff is still broad, though suspicion for acute encephalopathy.  Anticipate likely admission, however workup still pending.  This may be altered or completely changed pending workup.   This chart was dictated using voice recognition software.  Despite best efforts to proofread, errors can  occur which can change the documentation meaning.         Final Clinical Impression(s) / ED Diagnoses Final diagnoses:  Altered mental status, unspecified altered mental status type  Hypertensive urgency    Rx / DC Orders ED Discharge Orders     None         Cecil Cobbs, PA-C 12/18/21 1910    Virgina Norfolk, DO 12/18/21 2155

## 2021-12-18 NOTE — ED Triage Notes (Signed)
Patient brought in by sister for evaluation of worsening altered mental status that started several months ago but got significantly worse one month ago where patient was no longer able to care for himself. Patient is alert and in no apparent distress at this time.

## 2021-12-18 NOTE — ED Provider Triage Note (Signed)
Emergency Medicine Provider Triage Evaluation Note  Kenneth Benitez , a 76 y.o. male  was evaluated in triage.  Pt complains of altered mental status.  Patient is accompanied by daughter who is primary historian.  Patient lives with his second daughter and they have noticed increasing cognitive decline over the past several months with increasing intensity over the past 4 to 6 weeks.  Patient has had "increased difficulty remembering things."   Denies fever, chills, night sweats, chest pain, shortness of breath, abdominal pain, nausea, vomiting, neck stiffness/pain, urinary symptoms, change in bowel habits.  Patient states that he does have a cough which has been present for the past several days.  He also notes generalized weakness in his lower extremities that has been going on for weeks and has led to more difficult ambulation.  Daughter is requesting placement given inability to take care of patient at home.  Review of Systems  Positive: See above Negative:   Physical Exam  BP (!) 181/96 (BP Location: Left Arm)   Pulse 89   Temp 98.4 F (36.9 C) (Oral)   Resp 17   SpO2 100%  Gen:   Awake, no distress.  Patient alert and orientated x4. Resp:  Normal effort  MSK:   Moves extremities without difficulty  Other:  Lungs clear to auscultation.  No abdominal tenderness.  Neck supple with no signs of meningismus.  Lower extremity edema 2+ bilaterally.  Medical Decision Making  Medically screening exam initiated at 12:51 PM.  Appropriate orders placed.  Kenneth Benitez was informed that the remainder of the evaluation will be completed by another provider, this initial triage assessment does not replace that evaluation, and the importance of remaining in the ED until their evaluation is complete.     Peter Garter, [place] 12/18/21 1254

## 2021-12-18 NOTE — Progress Notes (Addendum)
Transition of Care [facility]) - Emergency Department Mini Assessment   Patient Details  Name: KDYN VONBEHREN MRN: [id number] Date of Birth: [birth year]  Transition of Care [facility]) CM/SW Contact:    Joeanthony Seeling C Tarpley-Carter, LCSWA Phone Number: 12/18/2021, 6:38 PM   Clinical Narrative: TOC CSW attempted to contact pts wife, Loki Wuthrich  [phone]. CSW left HIPPA compliant message with my contact information.  Trinitee Horgan Tarpley-Carter, MSW, LCSW-A Pronouns:  She/Her/Hers [facility] HealthTransitions of Care Clinical Social Worker Direct Number:  [phone] [email or web address]    ED Mini Assessment: What brought you to the Emergency Department? : Altered Mental Status  Barriers to Discharge: No Barriers Identified     Means of departure: Ambulance  Interventions which prevented an admission or readmission: SNF Placement    Patient Contact and Communications Key Contact 1: Marigene Ehlers     Contact Date: 12/18/21,     Contact Phone Number: [phone]    Patient states their goals for this hospitalization and ongoing recovery are:: Patients sister, Annette Stable wants pt placed in a ALF. CMS Medicare.gov Compare Post Acute Care list provided to:: Other (Comment Required) (Patients Sister, Tunis Gentle) Choice offered to / list presented to : Patient  Admission diagnosis:  Weakness; HTN There are no problems to display for this patient.  PCP:  Quitman Livings, MD Pharmacy:  No Pharmacies Listed

## 2021-12-18 NOTE — H&P (Addendum)
History and Physical  Kenneth Benitez VVO:[id number] DOB: Mar 29, 1946 DOA: 12/18/2021  Referring physician: Dr. Lockie Mola, EDP. PCP: Quitman Livings, MD  Outpatient Specialists: None.  Medical care from the [facility]. Patient coming from: Home.  Chief Complaint: Failure to thrive, cough.  HPI: Kenneth Benitez is a 76 y.o. male with medical history significant for hypertension, who presented from home where he lives with his sister, due to gradual decline in his overall health for the past 4 weeks.  Associated with generalized fatigue, a decline in his memory, and a sedentary lifestyle.  Endorses developing a productive cough about 4 days ago.  No subjective fevers or chills.  Reports new bilateral lower extremity edema for the past 3 weeks.  No chest pain.  No GI symptoms no focal motor deficits.  His mood has been depressed, recently separated from his wife, multiple deaths in the family, lost his brother in 08-14-20 and his sister to bone cancer in 08/15/2019.    He was initially seen at the [facility] earlier today where he had a chest x-ray done.  Due to slow updates from the treatment team at the [facility] hospital, per the patient's sister, he was brought in to [facility], ED for further evaluation.    Work-up in the ED revealed failure to thrive, mild cognitive deficit, and concern for aspiration pneumonia.  Also possible undiagnosed depression.  Tearful in the room.  Agrees to see psych inpatient.  The patient was started on empiric IV antibiotics.  First set of high-sensitivity troponin and BNP were within normal limits.  CT angio was negative for pulmonary embolism.  TRH, hospitalist service, was asked to admit.  ED Course: Tmax 98.4.  BP 175/93, pulse 80, respiration rate 17, O2 saturation 99% on room air.  Review of Systems: Review of systems as noted in the HPI. All other systems reviewed and are negative.  Past medical history: Hypertension  Past surgical history: None on file.  Social  History: Quit smoking about a month ago.  No alcohol intake in more than 3 years.  No illicit drug use.   No Known Allergies  Family history: Sister deceased in 08-15-19 with history of bone cancer  Prior to Admission medications   Medication Sig Start Date End Date Taking? Authorizing Provider  methocarbamol (ROBAXIN) 500 MG tablet Take 1 tablet (500 mg total) by mouth 2 (two) times daily as needed for muscle spasms. 03/14/16   Everlene Farrier, PA-C  naproxen (NAPROSYN) 250 MG tablet Take 1 tablet (250 mg total) by mouth 2 (two) times daily with a meal. 03/14/16   Everlene Farrier, PA-C  predniSONE (DELTASONE) 20 MG tablet 3 po once a day for 2 days, then 2 po once a day for 3 days, then 1 po once a day for 3 days 05/23/21   Cathren Laine, MD    Physical Exam: BP (!) 132/91 (BP Location: Right Arm)   Pulse 87   Temp 98.3 F (36.8 C) (Oral)   Resp 17   SpO2 99%   General: 76 y.o. year-old male well developed well nourished in no acute distress.  Alert and minimally interactive. Cardiovascular: Regular rate and rhythm with no rubs or gallops.  No thyromegaly or JVD noted.  2+ pitting edema in lower extremities bilaterally.  Respiratory: Diffuse rales bilaterally.  Poor inspiratory effort. Abdomen: Soft nontender nondistended with normal bowel sounds x4 quadrants. Muskuloskeletal: No cyanosis or clubbing.  2+ pitting edema in lower extremities bilaterally. Neuro: CN II-XII intact, strength,  sensation, reflexes Skin: No ulcerative lesions noted or rashes Psychiatry: Judgement and insight appear normal. Mood is depressive for condition and setting          Labs on Admission:  Basic Metabolic Panel: Recent Labs  Lab 12/18/21 1251  NA 143  K 3.0*  CL 105  CO2 27  GLUCOSE 79  BUN 12  CREATININE 1.42*  CALCIUM 9.9  MG 2.0   Liver Function Tests: Recent Labs  Lab 12/18/21 1251  AST 24  ALT 23  ALKPHOS 78  BILITOT 0.4  PROT 7.9  ALBUMIN 4.1   No results for input(s):  "LIPASE", "AMYLASE" in the last 168 hours. Recent Labs  Lab 12/18/21 1803  AMMONIA 19   CBC: Recent Labs  Lab 12/18/21 1251  WBC 12.9*  NEUTROABS 9.2*  HGB 13.7  HCT 43.2  MCV 82.6  PLT 246   Cardiac Enzymes: No results for input(s): "CKTOTAL", "CKMB", "CKMBINDEX", "TROPONINI" in the last 168 hours.  BNP (last 3 results) Recent Labs    12/18/21 1803  BNP 48.1    ProBNP (last 3 results) No results for input(s): "PROBNP" in the last 8760 hours.  CBG: Recent Labs  Lab 12/18/21 1701  GLUCAP 78    Radiological Exams on Admission: CT Angio Chest PE W and/or Wo Contrast  Result Date: 12/18/2021 CLINICAL DATA:  High probability pulmonary embolism; decreased sensation in lower extremities; decreasing cognitive function EXAM: CT ANGIOGRAPHY CHEST WITH CONTRAST TECHNIQUE: Multidetector CT imaging of the chest was performed using the standard protocol during bolus administration of intravenous contrast. Multiplanar CT image reconstructions and MIPs were obtained to evaluate the vascular anatomy. RADIATION DOSE REDUCTION: This exam was performed according to the departmental dose-optimization program which includes automated exposure control, adjustment of the mA and/or kV according to patient size and/or use of iterative reconstruction technique. CONTRAST:  124mL OMNIPAQUE IOHEXOL 350 MG/ML SOLN COMPARISON:  None Available. FINDINGS: Cardiovascular: Satisfactory opacification of the pulmonary arteries to the segmental level. No evidence of pulmonary embolism. Normal heart size. No pericardial effusion. Mild aortic atherosclerosis. Mediastinum/Nodes: Patulous esophagus containing air-fluid level up to the upper-mid esophagus. No thoracic adenopathy by size. Unremarkable trachea. Lungs/Pleura: Centrilobular emphysema. Numerous bilateral centrilobular micro nodules and tree in bud opacities with more confluence ground-glass opacities in the posterior right lower lobe. Mild diffuse bronchial  wall thickening. Upper Abdomen: 1.0 cm short axis lymph node in the upper abdomen is favored reactive but technically indeterminate. No acute abnormality in the visualized abdomen. Musculoskeletal: No chest wall abnormality. No acute osseous findings. Review of the MIP images confirms the above findings. IMPRESSION: 1. Negative for acute pulmonary embolism. 2. Pulmonary findings consistent with small airway infection/inflammation. Right lower lobe opacities consistent with pneumonia/pneumonitis. Given the patulous esophagus containing layering fluid this may be related to aspiration. 3. Aortic Atherosclerosis (ICD10-I70.0) and Emphysema (ICD10-J43.9). Electronically Signed   By: Placido Sou M.D.   On: 12/18/2021 19:21   CT Head Wo Contrast  Result Date: 12/18/2021 CLINICAL DATA:  Altered mental status EXAM: CT HEAD WITHOUT CONTRAST TECHNIQUE: Contiguous axial images were obtained from the base of the skull through the vertex without intravenous contrast. RADIATION DOSE REDUCTION: This exam was performed according to the departmental dose-optimization program which includes automated exposure control, adjustment of the mA and/or kV according to patient size and/or use of iterative reconstruction technique. COMPARISON:  05/22/2021 FINDINGS: Brain: No acute intracranial findings are seen in noncontrast CT brain. There are no signs of bleeding within the cranium. Cortical sulci  are prominent. There is decreased density in periventricular and subcortical white matter. Vascular: Unremarkable. Skull: Unremarkable. Sinuses/Orbits: There is opacification of left maxillary sinus. There is medial bulging of medial wall of left maxillary antrum. These findings appear stable. Other: None. IMPRESSION: No acute intracranial findings are seen in noncontrast CT brain. Atrophy. Small-vessel disease. Opacification of left maxillary sinus and bulging of medial wall of left maxillary antrum appear stable. Electronically Signed    By: Ernie Avena M.D.   On: 12/18/2021 19:10   DG Chest 2 View  Result Date: 12/18/2021 CLINICAL DATA:  Cough EXAM: CHEST - 2 VIEW COMPARISON:  07/23/2014 FINDINGS: Cardiac size is within normal limits. There are no signs of alveolar pulmonary edema. There is prominence of right hilum. Increased markings are seen in medial right lower lung field. Rest of the lung fields are clear. Left hemidiaphragm is slightly elevated. There is no pleural effusion or pneumothorax. IMPRESSION: Increased markings are seen in medial right lower lung fields suggesting atelectasis/pneumonia. Right hilum is more prominent on the left which may be due to adjacent infiltrate or enlarged lymph nodes. Short-term follow-up chest radiographs along with CT if warranted should be considered to rule out any underlying neoplastic process. Electronically Signed   By: Ernie Avena M.D.   On: 12/18/2021 13:24    EKG: I independently viewed the EKG done and my findings are as followed: Sinus rhythm rate of 84.  Nonspecific ST-T changes.  QTc 4 8.  Occasional PVCs.  Assessment/Plan Present on Admission:  CAP (community acquired pneumonia)  Principal Problem:   CAP (community acquired pneumonia)  Right lower lobe community-acquired pneumonia with concern for aspiration, POA Personally reviewed chest x-ray and CT scan done on admission.  Right lower lobe infiltrates noted.  Additionally, findings of esophagus containing layering fluid may be related to aspiration. Started on Unasyn in the ED, continue Speech therapist consulted for swallow evaluation Aspiration precautions in place Baseline procalcitonin tomorrow Repeat CBC in the morning  Hypertension, BP is not at goal, elevated Start Norvasc 5 mg daily IV hydralazine as needed with parameters. Continue to monitor vital signs  Failure to thrive Moderate protein calorie malnutrition Weight 76 kg Moderate muscle mass loss Dietitian consult.  Possible  undiagnosed depression Not on antidepressant prior to admission He agrees to see psychiatry inpatient The patient is a Cytogeneticist.  Bilateral lower extremity edema, unclear etiology Follow TSH, 2D echo Rule out DVT with bilateral duplex ultrasound Albumin and LFTs within normal limits 4.1. Elevate lower extremities.  Generalized weakness/fatigue Obtain TSH PT OT assessment in the morning Fall precautions TOC consulted to assist with DC planning.  Hypokalemia Serum potassium 3.0 Repleted Check and replete electrolytes as indicated  Memory deficits Possibly contributed by his depressive mood Obtain B12 and B1 levels  CKD 3A No baseline records to compare Presented with creatinine of 1.42 with GFR 52 Avoid nephrotoxic agents, dehydration and hypotension Monitor urine output with strict I's and O's Repeat renal panel in the morning.   Critical care time: 65 minutes.   DVT prophylaxis: Subcu Lovenox daily  Code Status: Full code  Family Communication: None at bedside  Disposition Plan: Admitted to telemetry medical unit  Consults called: Psychiatry.  Admission status: Inpatient status.   Status is: Inpatient The patient requires at least 2 midnights for further evaluation and treatment of present condition.   Darlin Drop MD [facility] Pager [phone]  If 7PM-7AM, please contact night-coverage [email or web address] Password [email or web address]  12/18/2021, 8:02 PM

## 2021-12-19 ENCOUNTER — Inpatient Hospital Stay (HOSPITAL_COMMUNITY): Payer: Medicare Other

## 2021-12-19 ENCOUNTER — Other Ambulatory Visit: Payer: Self-pay

## 2021-12-19 DIAGNOSIS — J189 Pneumonia, unspecified organism: Secondary | ICD-10-CM | POA: Diagnosis not present

## 2021-12-19 DIAGNOSIS — F321 Major depressive disorder, single episode, moderate: Secondary | ICD-10-CM

## 2021-12-19 DIAGNOSIS — I5031 Acute diastolic (congestive) heart failure: Secondary | ICD-10-CM

## 2021-12-19 DIAGNOSIS — N1831 Chronic kidney disease, stage 3a: Secondary | ICD-10-CM

## 2021-12-19 DIAGNOSIS — R9431 Abnormal electrocardiogram [ECG] [EKG]: Secondary | ICD-10-CM

## 2021-12-19 DIAGNOSIS — R5381 Other malaise: Secondary | ICD-10-CM

## 2021-12-19 DIAGNOSIS — I10 Essential (primary) hypertension: Secondary | ICD-10-CM | POA: Diagnosis not present

## 2021-12-19 LAB — CBC WITH DIFFERENTIAL/PLATELET
Abs Immature Granulocytes: 0.04 10*3/uL (ref 0.00–0.07)
Basophils Absolute: 0.1 10*3/uL (ref 0.0–0.1)
Basophils Relative: 1 %
Eosinophils Absolute: 0.6 10*3/uL — ABNORMAL HIGH (ref 0.0–0.5)
Eosinophils Relative: 6 %
HCT: 36 % — ABNORMAL LOW (ref 39.0–52.0)
Hemoglobin: 12.1 g/dL — ABNORMAL LOW (ref 13.0–17.0)
Immature Granulocytes: 0 %
Lymphocytes Relative: 16 %
Lymphs Abs: 1.7 10*3/uL (ref 0.7–4.0)
MCH: 26.7 pg (ref 26.0–34.0)
MCHC: 33.6 g/dL (ref 30.0–36.0)
MCV: 79.5 fL — ABNORMAL LOW (ref 80.0–100.0)
Monocytes Absolute: 0.9 10*3/uL (ref 0.1–1.0)
Monocytes Relative: 9 %
Neutro Abs: 7.1 10*3/uL (ref 1.7–7.7)
Neutrophils Relative %: 68 %
Platelets: 212 10*3/uL (ref 150–400)
RBC: 4.53 MIL/uL (ref 4.22–5.81)
RDW: 15.5 % (ref 11.5–15.5)
WBC: 10.4 10*3/uL (ref 4.0–10.5)
nRBC: 0 % (ref 0.0–0.2)

## 2021-12-19 LAB — MAGNESIUM: Magnesium: 1.8 mg/dL (ref 1.7–2.4)

## 2021-12-19 LAB — COMPREHENSIVE METABOLIC PANEL
ALT: 18 U/L (ref 0–44)
AST: 22 U/L (ref 15–41)
Albumin: 3.1 g/dL — ABNORMAL LOW (ref 3.5–5.0)
Alkaline Phosphatase: 55 U/L (ref 38–126)
Anion gap: 10 (ref 5–15)
BUN: 10 mg/dL (ref 8–23)
CO2: 28 mmol/L (ref 22–32)
Calcium: 9 mg/dL (ref 8.9–10.3)
Chloride: 103 mmol/L (ref 98–111)
Creatinine, Ser: 1.36 mg/dL — ABNORMAL HIGH (ref 0.61–1.24)
GFR, Estimated: 54 mL/min — ABNORMAL LOW (ref >60)
Glucose, Bld: 103 mg/dL — ABNORMAL HIGH (ref 70–99)
Potassium: 3.3 mmol/L — ABNORMAL LOW (ref 3.5–5.1)
Sodium: 141 mmol/L (ref 135–145)
Total Bilirubin: 0.7 mg/dL (ref 0.3–1.2)
Total Protein: 6.2 g/dL — ABNORMAL LOW (ref 6.5–8.1)

## 2021-12-19 LAB — TSH: TSH: 1.703 u[IU]/mL (ref 0.350–4.500)

## 2021-12-19 LAB — ECHOCARDIOGRAM COMPLETE
Area-P 1/2: 4.06 cm2
Height: 65 in
P 1/2 time: 492 msec
S' Lateral: 2.7 cm
Weight: 2433.88 oz

## 2021-12-19 LAB — URINE CULTURE: Culture: NO GROWTH

## 2021-12-19 LAB — PROCALCITONIN: Procalcitonin: <0.1 ng/mL

## 2021-12-19 LAB — PHOSPHORUS: Phosphorus: 2.8 mg/dL (ref 2.5–4.6)

## 2021-12-19 MED ORDER — POTASSIUM CHLORIDE CRYS ER 20 MEQ PO TBCR: 40.0000 meq | EXTENDED_RELEASE_TABLET | Freq: Once | ORAL | Status: DC

## 2021-12-19 MED ORDER — ADULT MULTIVITAMIN W/MINERALS CH
1.0000 | ORAL_TABLET | Freq: Every day | ORAL | Status: DC
  Administered 2021-12-20 – 2021-12-22 (×3): 1 via ORAL
  Filled 2021-12-19 (×3): qty 1

## 2021-12-19 MED ORDER — POTASSIUM CHLORIDE 20 MEQ PO PACK
40.0000 meq | PACK | Freq: Every day | ORAL | Status: DC
  Administered 2021-12-19 – 2021-12-22 (×4): 40 meq via ORAL
  Filled 2021-12-19 (×5): qty 2

## 2021-12-19 MED ORDER — ENSURE ENLIVE PO LIQD
237.0000 mL | Freq: Two times a day (BID) | ORAL | Status: DC
  Administered 2021-12-19 – 2021-12-21 (×5): 237 mL via ORAL
  Filled 2021-12-19 (×2): qty 237

## 2021-12-19 MED ORDER — MIRTAZAPINE 15 MG PO TBDP
7.5000 mg | ORAL_TABLET | Freq: Every day | ORAL | Status: DC
  Administered 2021-12-19 – 2021-12-21 (×3): 7.5 mg via ORAL
  Filled 2021-12-19 (×5): qty 0.5

## 2021-12-19 MED ORDER — POTASSIUM CHLORIDE CRYS ER 20 MEQ PO TBCR: 40.0000 meq | EXTENDED_RELEASE_TABLET | Freq: Every day | ORAL | Status: DC

## 2021-12-19 MED ORDER — FUROSEMIDE 20 MG PO TABS
20.0000 mg | ORAL_TABLET | Freq: Every day | ORAL | Status: DC
  Administered 2021-12-19 – 2021-12-22 (×4): 20 mg via ORAL
  Filled 2021-12-19 (×4): qty 1

## 2021-12-19 NOTE — Progress Notes (Signed)
Initial Nutrition Assessment  INTERVENTION:   -Ensure Plus High Protein po BID, each supplement provides 350 kcal and 20 grams of protein.   -Multivitamin with minerals daily  NUTRITION DIAGNOSIS:   Inadequate oral intake related to lethargy/confusion, acute illness as evidenced by per patient/family report.  GOAL:   Patient will meet greater than or equal to 90% of their needs  MONITOR:   PO intake, Supplement acceptance, Labs, Weight trends, I & O's  REASON FOR ASSESSMENT:   Consult Assessment of nutrition requirement/status  ASSESSMENT:   76 y.o. male with medical history significant for hypertension, who presented from home where he lives with his sister, due to gradual decline in his overall health for the past 4 weeks.  Associated with generalized fatigue, a decline in his memory, and a sedentary lifestyle.  Endorses developing a productive cough about 4 days ago.  No subjective fevers or chills.  Reports new bilateral lower extremity edema for the past 3 weeks. Admitted for failure to thrive, mild cognitive deficit, and concern for aspiration pneumonia.  Patient alert/oriented x 2. Pt likely has not been eating well during the past few weeks r/t cognitive deficits and possible depression.  Noted Vitamin B-12 and thiamine labs are being checked. No PO documented yet. Will order Ensure supplements for additional kcals and protein.  Per weight records, pt has lost 16 lbs since 2/10 (9% wt loss x 7 months, insignificant for time frame).   Per nursing documentation, pt with moderate BLE edema.   Medications: KCl  Labs reviewed.  NUTRITION - FOCUSED PHYSICAL EXAM:  Unable to complete, working remote  Diet Order:   Diet Order             Diet Heart Room service appropriate? Yes; Fluid consistency: Thin  Diet effective now                   EDUCATION NEEDS:   No education needs have been identified at this time  Skin:  Skin Assessment: Reviewed RN  Assessment  Last BM:  9/9 -type 6  Height:   Ht Readings from Last 1 Encounters:  12/19/21 5\' 5"  (1.651 m)    Weight:   Wt Readings from Last 1 Encounters:  12/19/21 69 kg   BMI:  Body mass index is 25.31 kg/m.  Estimated Nutritional Needs:   Kcal:  1750-1950  Protein:  75-85g  Fluid:  1.9L/day  02/18/22, MS, RD, LDN Inpatient Clinical Dietitian Contact information available via Amion

## 2021-12-19 NOTE — Consult Note (Signed)
Face-to-Face Psychiatry Consult   Reason for Consult:  Possible undiagnosed depression  Referring Physician:  Delma Post  Patient Identification: Kenneth Benitez MRN:  [id number] Principal Diagnosis: CAP (community acquired pneumonia) Diagnosis:  Principal Problem:   CAP (community acquired pneumonia) Active Problems:   Current moderate episode of major depressive disorder without prior episode (HCC)   Total Time spent with patient: 30 minutes  Subjective:   (Per admitting provider): Kenneth Benitez is a 76 y.o. male with medical history significant for hypertension, who presented from home where he lives with his sister, due to gradual decline in his overall health for the past 4 weeks.  Associated with generalized fatigue, a decline in his memory, and a sedentary lifestyle.  Endorses developing a productive cough about 4 days ago.  No subjective fevers or chills.  Reports new bilateral lower extremity edema for the past 3 weeks.  No chest pain.  No GI symptoms no focal motor deficits.  His mood has been depressed, recently separated from his wife, multiple deaths in the family, lost his brother in 2022 and his sister to bone cancer in 2021.     He was initially seen at the [facility] earlier today where he had a chest x-ray done.  Due to slow updates from the treatment team at the [facility] hospital, per the patient's sister, he was brought in to [facility], ED for further evaluation.     Work-up in the ED revealed failure to thrive, mild cognitive deficit, and concern for aspiration pneumonia.  Also possible undiagnosed depression.  Tearful in the room.  Agrees to see psych inpatient.  The patient was started on empiric IV antibiotics.  First set of high-sensitivity troponin and BNP were within normal limits.  CT angio was negative for pulmonary embolism.  TRH, hospitalist service, was asked to admit.  HPI:   On my assessment today, the pt reports anhedonia but denies depressed mood or sadness.  He reports that sleep is okay without difficulty initiating or maintaining sleep. He reports low energy and decrease in motivation. Reports appetite is up and down and states that he has lost weight over the last few months. He has poor concentration and and poor memory. He is aware that his memory has deficits. Denies SI and HI. Denies trouble managing anxiety or worry. Denies panic attacks. Denies manic symptoms. Denies psychotic symptoms.    Past Psychiatric History:  Denies previous psych dx Reports 1 previous psych hosp about 1 year ago "to run some tests" denies diagnosis or medication treatment Denies h/o psych meds. Denies any current psych meds.    Risk to Self:  denies any direct harm to self. Not caring for self, failure to thrive could be indirect way pt is harm to self.  Risk to Others:  denies   Past Medical History: History reviewed. No pertinent past medical history. No past surgical history on file. Family History: No family history on file. Family Psychiatric  History: denies family psych history and denies family h/o SA.   Social History:  Social History   Substance and Sexual Activity  Alcohol Use Yes     Social History   Substance and Sexual Activity  Drug Use Not on file    Social History   Socioeconomic History   Marital status: Married    Spouse name: Not on file   Number of children: Not on file   Years of education: Not on file   Highest education level: Not on file  Occupational History   Not on file  Tobacco Use   Smoking status: Every Day    Types: Cigarettes   Smokeless tobacco: Never  Substance and Sexual Activity   Alcohol use: Yes   Drug use: Not on file   Sexual activity: Not on file  Other Topics Concern   Not on file  Social History Narrative   Not on file   Social Determinants of Health   Financial Resource Strain: Not on file  Food Insecurity: Not on file  Transportation Needs: Not on file  Physical Activity: Not on file   Stress: Not on file  Social Connections: Not on file   Additional Social History:    Allergies:  No Known Allergies  Labs:  Results for orders placed or performed during the hospital encounter of 12/18/21 (from the past 48 hour(s))  Comprehensive metabolic panel     Status: Abnormal   Collection Time: 12/18/21 12:51 PM  Result Value Ref Range   Sodium 143 135 - 145 mmol/L   Potassium 3.0 (L) 3.5 - 5.1 mmol/L   Chloride 105 98 - 111 mmol/L   CO2 27 22 - 32 mmol/L   Glucose, Bld 79 70 - 99 mg/dL    Comment: Glucose reference range applies only to samples taken after fasting for at least 8 hours.   BUN 12 8 - 23 mg/dL   Creatinine, Ser 1.61 (H) 0.61 - 1.24 mg/dL   Calcium 9.9 8.9 - 09.6 mg/dL   Total Protein 7.9 6.5 - 8.1 g/dL   Albumin 4.1 3.5 - 5.0 g/dL   AST 24 15 - 41 U/L   ALT 23 0 - 44 U/L   Alkaline Phosphatase 78 38 - 126 U/L   Total Bilirubin 0.4 0.3 - 1.2 mg/dL   GFR, Estimated 52 (L) >60 mL/min    Comment: (NOTE) Calculated using the CKD-EPI Creatinine Equation (2021)    Anion gap 11 5 - 15    Comment: Performed at [facility], [address], [place], [place] [address]  CBC with Differential/Platelet     Status: Abnormal   Collection Time: 12/18/21 12:51 PM  Result Value Ref Range   WBC 12.9 (H) 4.0 - 10.5 K/uL   RBC 5.23 4.22 - 5.81 MIL/uL   Hemoglobin 13.7 13.0 - 17.0 g/dL   HCT 98.1 19.1 - 47.8 %   MCV 82.6 80.0 - 100.0 fL   MCH 26.2 26.0 - 34.0 pg   MCHC 31.7 30.0 - 36.0 g/dL   RDW 29.5 (H) 62.1 - 30.8 %   Platelets 246 150 - 400 K/uL   nRBC 0.0 0.0 - 0.2 %   Neutrophils Relative % 71 %   Neutro Abs 9.2 (H) 1.7 - 7.7 K/uL   Lymphocytes Relative 16 %   Lymphs Abs 2.1 0.7 - 4.0 K/uL   Monocytes Relative 9 %   Monocytes Absolute 1.1 (H) 0.1 - 1.0 K/uL   Eosinophils Relative 3 %   Eosinophils Absolute 0.4 0.0 - 0.5 K/uL   Basophils Relative 0 %   Basophils Absolute 0.1 0.0 - 0.1 K/uL   Immature Granulocytes 1 %   Abs Immature Granulocytes  0.06 0.00 - 0.07 K/uL    Comment: Performed at [facility], [address] [place], [place] [address]  Urine Culture     Status: None   Collection Time: 12/18/21 12:51 PM   Specimen: Urine, Clean Catch  Result Value Ref Range   Specimen Description URINE, CLEAN  CATCH    Special Requests NONE    Culture      NO GROWTH Performed at [facility], [address] [place], [place] [address]    Report Status 12/19/2021 FINAL   Magnesium     Status: None   Collection Time: 12/18/21 12:51 PM  Result Value Ref Range   Magnesium 2.0 1.7 - 2.4 mg/dL    Comment: Performed at [facility], [address] [place], [place] [address]  Resp Panel by RT-PCR (Flu A&B, Covid) Anterior Nasal Swab     Status: None   Collection Time: 12/18/21 12:53 PM   Specimen: Anterior Nasal Swab  Result Value Ref Range   SARS Coronavirus 2 by RT PCR NEGATIVE NEGATIVE    Comment: (NOTE) SARS-CoV-2 target nucleic acids are NOT DETECTED.  The SARS-CoV-2 RNA is generally detectable in upper respiratory specimens during the acute phase of infection. The lowest concentration of SARS-CoV-2 viral copies this assay can detect is 138 copies/mL. A negative result does not preclude SARS-Cov-2 infection and should not be used as the sole basis for treatment or other patient management decisions. A negative result may occur with  improper specimen collection/handling, submission of specimen other than nasopharyngeal swab, presence of viral mutation(s) within the areas targeted by this assay, and inadequate number of viral copies(<138 copies/mL). A negative result must be combined with clinical observations, patient history, and epidemiological information. The expected result is Negative.  Fact Sheet for Patients:  BloggerCourse.comhttps://www.fda.gov/media/152166/download  Fact Sheet for Healthcare Providers:  SeriousBroker.ithttps://www.fda.gov/media/152162/download  This test is no t yet approved or cleared by the [place]  FDA and  has been authorized for detection and/or diagnosis of SARS-CoV-2 by FDA under an Emergency Use Authorization (EUA). This EUA will remain  in effect (meaning this test can be used) for the duration of the COVID-19 declaration under Section 564(b)(1) of the Act, 21 U.S.C.section 360bbb-3(b)(1), unless the authorization is terminated  or revoked sooner.       Influenza A by PCR NEGATIVE NEGATIVE   Influenza B by PCR NEGATIVE NEGATIVE    Comment: (NOTE) The Xpert Xpress SARS-CoV-2/FLU/RSV plus assay is intended as an aid in the diagnosis of influenza from Nasopharyngeal swab specimens and should not be used as a sole basis for treatment. Nasal washings and aspirates are unacceptable for Xpert Xpress SARS-CoV-2/FLU/RSV testing.  Fact Sheet for Patients: BloggerCourse.comhttps://www.fda.gov/media/152166/download  Fact Sheet for Healthcare Providers: SeriousBroker.ithttps://www.fda.gov/media/152162/download  This test is not yet approved or cleared by the [place] FDA and has been authorized for detection and/or diagnosis of SARS-CoV-2 by FDA under an Emergency Use Authorization (EUA). This EUA will remain in effect (meaning this test can be used) for the duration of the COVID-19 declaration under Section 564(b)(1) of the Act, 21 U.S.C. section 360bbb-3(b)(1), unless the authorization is terminated or revoked.  Performed at [facility], [address] [place], [place] [address]   CBG monitoring, ED     Status: None   Collection Time: 12/18/21  5:01 PM  Result Value Ref Range   Glucose-Capillary 78 70 - 99 mg/dL    Comment: Glucose reference range applies only to samples taken after fasting for at least 8 hours.  Troponin I (High Sensitivity)     Status: None   Collection Time: 12/18/21  6:03 PM  Result Value Ref Range   Troponin I (High Sensitivity) 8 <18 ng/L    Comment: (NOTE) Elevated high sensitivity troponin I (hsTnI) values and significant  changes  across serial measurements may  suggest ACS but many other  chronic and acute conditions are known to elevate hsTnI results.  Refer to the "Links" section for chest pain algorithms and additional  guidance. Performed at [facility], [address], [place], [place] [address]   Brain natriuretic peptide     Status: None   Collection Time: 12/18/21  6:03 PM  Result Value Ref Range   B Natriuretic Peptide 48.1 0.0 - 100.0 pg/mL    Comment: Performed at [facility], [address] [place], [place] [address]  Ammonia     Status: None   Collection Time: 12/18/21  6:03 PM  Result Value Ref Range   Ammonia 19 9 - 35 umol/L    Comment: Performed at [facility], [address], [place], [place] [address]  Lactic acid, plasma     Status: None   Collection Time: 12/18/21  6:03 PM  Result Value Ref Range   Lactic Acid, Venous 1.2 0.5 - 1.9 mmol/L    Comment: Performed at [facility], [address], [place], [place] [address]  Urinalysis, Routine w reflex microscopic Urine, Clean Catch     Status: Abnormal   Collection Time: 12/18/21  6:06 PM  Result Value Ref Range   Color, Urine YELLOW YELLOW   APPearance CLEAR CLEAR   Specific Gravity, Urine 1.024 1.005 - 1.030   pH 5.0 5.0 - 8.0   Glucose, UA NEGATIVE NEGATIVE mg/dL   Hgb urine dipstick NEGATIVE NEGATIVE   Bilirubin Urine NEGATIVE NEGATIVE   Ketones, ur 5 (A) NEGATIVE mg/dL   Protein, ur 30 (A) NEGATIVE mg/dL   Nitrite NEGATIVE NEGATIVE   Leukocytes,Ua SMALL (A) NEGATIVE   RBC / HPF 0-5 0 - 5 RBC/hpf   WBC, UA 11-20 0 - 5 WBC/hpf   Bacteria, UA RARE (A) NONE SEEN   Mucus PRESENT     Comment: Performed at [facility], [address] [place], [place] [address]  Troponin I (High Sensitivity)     Status: None   Collection Time: 12/18/21  8:13 PM  Result Value Ref Range   Troponin I (High Sensitivity) 8 <18 ng/L    Comment: (NOTE) Elevated high sensitivity troponin I (hsTnI) values and significant  changes across serial  measurements may suggest ACS but many other  chronic and acute conditions are known to elevate hsTnI results.  Refer to the "Links" section for chest pain algorithms and additional  guidance. Performed at [facility], [address] [place], [place] [address]     Current Facility-Administered Medications  Medication Dose Route Frequency Provider Last Rate Last Admin   acetaminophen (TYLENOL) tablet 650 mg  650 mg Oral Q6H PRN Dow Adolph N, DO       amLODipine (NORVASC) tablet 5 mg  5 mg Oral Daily [place], Carole N, DO   5 mg at 12/19/21 0808   Ampicillin-Sulbactam (UNASYN) 3 g in sodium chloride 0.9 % 100 mL IVPB  3 g Intravenous Q6H Curatolo, Adam, DO   Stopped at 12/19/21 1147   enoxaparin (LOVENOX) injection 40 mg  40 mg Subcutaneous Q24H Dow Adolph N, DO   40 mg at 12/18/21 2059   hydrALAZINE (APRESOLINE) injection 5 mg  5 mg Intravenous Q6H PRN Dow Adolph N, DO       ondansetron (ZOFRAN) injection 4 mg  4 mg Intravenous Q6H PRN Dow Adolph N, DO       polyethylene  glycol (MIRALAX / GLYCOLAX) packet 17 g  17 g Oral Daily PRN Darlin Drop, DO        Musculoskeletal: Strength & Muscle Tone: Laying in bed   Gait & Station: Laying in bed   Patient leans: Laying in bed              Psychiatric Specialty Exam:  Presentation  General Appearance: Appropriate for Environment (odorous)  Eye Contact:-- (off and on)  Speech:Slow  Speech Volume:Decreased  Handedness:No data recorded  Mood and Affect  Mood:Dysphoric  Affect:Constricted   Thought Process  Thought Processes:Linear  Descriptions of Associations:Intact  Orientation:Partial  Thought Content:Logical  History of Schizophrenia/Schizoaffective disorder:No data recorded Duration of Psychotic Symptoms:No data recorded Hallucinations:Hallucinations: Other (comment)  Ideas of Reference:None  Suicidal Thoughts:Suicidal Thoughts: No  Homicidal Thoughts:Homicidal Thoughts:  No   Sensorium  Memory:Immediate Fair; Remote Fair; Recent Poor  Judgment:Impaired  Insight:Shallow   Executive Functions  Concentration:Fair  Attention Span:Fair  Recall:No data recorded Fund of Knowledge:No data recorded Language:No data recorded  Psychomotor Activity  Psychomotor Activity:Psychomotor Activity: Decreased   Assets  Assets:No data recorded  Sleep  Sleep:Sleep: Fair   Physical Exam: Physical Exam Vitals reviewed.    Review of Systems  Psychiatric/Behavioral:  Positive for depression and memory loss. Negative for hallucinations, substance abuse and suicidal ideas. The patient is not nervous/anxious and does not have insomnia.    Blood pressure (!) 164/97, pulse 76, temperature 97.7 F (36.5 C), temperature source Oral, resp. rate 16, height 5\' 5"  (1.651 m), weight 69 kg, SpO2 96 %. Body mass index is 25.31 kg/m.  Treatment Plan Summary:  Psychiatric Assessment: -major depressive disorder, moderate vs mood disorder 2/2 underlying medication condition  Plan: -pt does not require inpatient psychiatric hospitalization  -pt declines medication to treat depressed mood at this time. If the pt changes his mind, he might benefit from either:  1. Zoloft 25-50 mg once daily.  2. Remeron 7.5 mg-15 mg once daily (this could help with sleep if the patient was under reporting any sleep problems. This would also help with appetite and mitigate weight loss).  -recommend outpatient psychotherapy for coping with recent losses   Psychiatry C/L service will sign-off.  Disposition: No evidence of imminent risk to self or others at present.   Patient does not meet criteria for psychiatric inpatient admission. Supportive therapy provided about ongoing stressors. Discussed crisis plan, support from social network, calling 911, coming to the Emergency Department, and calling Suicide Hotline.  , MD 12/19/2021 1:56 PM  Total Time Spent in Direct  Patient Care:  I personally spent 60 minutes on the unit in direct patient care. The direct patient care time included face-to-face time with the patient, reviewing the patient's chart, communicating with other professionals, and coordinating care. Greater than 50% of this time was spent in counseling or coordinating care with the patient regarding goals of hospitalization, psycho-education, and discharge planning needs.   02/18/2022, MD Psychiatrist

## 2021-12-19 NOTE — Assessment & Plan Note (Signed)
-  With concerns for aspiration pneumonia -Currently requiring oxygen supplementation -Continue as needed bronchodilators and the use of flutter valve/incentive spirometer -Continue current antibiotics -Follow clinical response.

## 2021-12-19 NOTE — Progress Notes (Signed)
Progress Note   Patient: Kenneth Benitez Kenneth Benitez:[id number] DOB: Apr 03, 1946 DOA: 12/18/2021     1 DOS: the patient was seen and examined on 12/19/2021   Brief hospital admission course: As per H&P written by Dr. Margo Aye on 12/18/2021 Kenneth Benitez is a 76 y.o. male with medical history significant for hypertension, who presented from home where he lives with his sister, due to gradual decline in his overall health for the past 4 weeks.  Associated with generalized fatigue, a decline in his memory, and a sedentary lifestyle.  Endorses developing a productive cough about 4 days ago.  No subjective fevers or chills.  Reports new bilateral lower extremity edema for the past 3 weeks.  No chest pain.  No GI symptoms no focal motor deficits.  His mood has been depressed, recently separated from his wife, multiple deaths in the family, lost his brother in 2022 and his sister to bone cancer in 2021.     He was initially seen at the [facility] earlier today where he had a chest x-ray done.  Due to slow updates from the treatment team at the [facility] hospital, per the patient's sister, he was brought in to [facility] ED for further evaluation.     Work-up in the ED revealed failure to thrive, mild cognitive deficit, and concern for aspiration pneumonia.  Also possible undiagnosed depression.  Tearful in the room.  Agrees to see psych inpatient.  The patient was started on empiric IV antibiotics.  First set of high-sensitivity troponin and BNP were within normal limits.  CT angio was negative for pulmonary embolism.  TRH, hospitalist service, was asked to admit.  Assessment and Plan: * CAP (community acquired pneumonia) -With concerns for aspiration pneumonia -Currently requiring oxygen supplementation -Continue as needed bronchodilators and the use of flutter valve/incentive spirometer -Continue current antibiotics -Follow clinical response.  Current moderate episode of major depressive disorder without prior  episode [facility]) - Patient seen by psychiatry service -Recommending outpatient follow-up with psychiatry. -Low-dose Remeron will be started. -Continue supportive care and consult reorientation.  Hypertension -No prior history of high blood pressure reported -Continue Norvasc 5 mg daily and as needed hydralazine -Heart healthy diet discussed with patient -Follow-up vital signs and further adjust antihypertensive regimen as required.  Hypokalemia -Magnesium within normal limits -Continue to follow electrolytes and further replete as needed -Patient encouraged to maintain adequate nutrition and hydration.  Bilateral lower extremity edema -TSH within normal limits -Patient reports no prior history of heart failure -2D echo with EF 55 to 60%, grade 1 diastolic dysfunction no significant valvular disorder.  No optimal results to evaluate regional wall motion abnormalities. -will add low dose lasix   Grade 1 diastolic dysfunction -Low-sodium diet discussed with patient -Continue strict intake and output -Follow daily weights. -Low-dose Lasix has been added. -Preserved ejection fraction appreciated on 2D echo.  Failure to thrive -Treatment in the setting of underlying depression and decreased oral intake -Continue feeding supplements and supportive care -Low-dose Remeron has been started -Follow thiamine and B12 levels.  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  Physical deconditioning -Patient has been seen by PT/OT with recommendation for a skilled nursing facility at discharge in order to improve strength and conditioning.  Subjective:  No chest pain, no nausea, no vomiting.  Reports feeling better.  Good saturation on room air.  Generally weak and deconditioned.  Physical Exam: Vitals:   12/19/21 0227 12/19/21 0325 12/19/21 0800  12/19/21 1200  BP:  (!) 143/94 (!) 164/97 (!) 158/83  Pulse:  88 76 82  Resp: 19 20 16 17   Temp:  97.6 F (36.4 C) 97.7 F (36.5 C)   TempSrc:  Oral Oral   SpO2: 100% 93% 96% 94%  Weight: 69 kg     Height: 5\' 5"  (1.651 m)      General exam: Alert, awake, oriented x 3; flat affect, no chest pain, no nausea or vomiting.  Reports feeling better.  Generally weak and deconditioned.  Requiring more than 2+ assist with ambulation and changing positions. Respiratory system: Positive rhonchi bilaterally; no using accessory muscle.  Good saturation on room air. Cardiovascular system:RRR. No rubs or gallops; no JVD. Gastrointestinal system: Abdomen is nondistended, soft and nontender. No organomegaly or masses felt. Normal bowel sounds heard. Central nervous system: Alert and oriented. No focal neurological deficits. Extremities: No cyanosis or clubbing; 1+ edema appreciated bilaterally. Skin: No petechiae. Psychiatry: Judgement and insight appear normal. Mood & affect appropriate.   Data Reviewed: Thiamine and B12: Pending CBC: WBCs 10.4, hemoglobin 12.1 platelets count 212 K Comprehensive metabolic panel: Sodium 141, potassium 3.3, BUN 10, creatinine 1.36 normal LFTs. Magnesium 1.8 Phosphorus 2.8  Family Communication: Significant other at bedside.  Disposition: Status is: Inpatient Remains inpatient appropriate because: Significantly weak/deconditioned follow-up patient will benefit of short-term rehabilitation.  TOC assisting with placement.  Continue treatment for pneumonia with IV antibiotics. -Continue supportive care.   Planned Discharge Destination: Skilled nursing facility short-term rehabilitation.   Author: , MD 12/19/2021 4:49 PM  For on call review [email or web address].

## 2021-12-19 NOTE — Evaluation (Signed)
Clinical/Bedside Swallow Evaluation Patient Details  Name: MARWAN LIPE MRN: [id number] Date of Birth: 05-Jan-1946  Today's Date: 12/19/2021 Time: SLP Start Time (ACUTE ONLY): 1605 SLP Stop Time (ACUTE ONLY): 1620 SLP Time Calculation (min) (ACUTE ONLY): 15 min  Past Medical History: History reviewed. No pertinent past medical history. Past Surgical History: No past surgical history on file. HPI:  Patient is a 76 y.o. male with PMH of HTN who presented to the hospital from home on 12/18/2021 due to 4 day h/o productive cough, new bilateral lower extremity edema for past 3 weeks. Over the past 4 weeks he has had a gradual decline in his memory and overall health, and has become more sedantary. Of note, he has had several life changing events such as death of his brother in 2020/07/25, death of his sister from bone cancer in 07/26/2019 and recent separation from his wife. He currently lives with his daughter. Work-up in ED revealed FTT, mild cognitive deficit and concern for aspiration PNA. In addition, possible undiagnosed depression. CT angio chest pulmonary PE was negative for PE but "Patulous esophagus containing air-fluid level up to the upper-mid esophagus; Pulmonary findings consistent with small airway infection/inflammation. Right lower lobe opacities consistent with pneumonia/pneumonitis. Given the patulous esophagus containing layering fluid this may be related to aspiration."    Assessment / Plan / Recommendation  Clinical Impression  Patient is exhibiting clinical s/s of dysphagia as per this bedside/clinical swallow evaluation but no h/o oral, pharyngeal or esophageal in his PMH and patient denies any previous or current dysphagia. He told SLP he eats two meals a day and although he reports decent appetite, he would not elaborate or provide adequate responses to SLP's questions. Both times when SLP mentioned that suspicion of aspiration was the reasoning for this swallow evaluation, he would shake  his head and say "no". He declined to take any solid PO's at this time and so assessment was limited to only liquids. RN did not report any observed dysphagia symptoms when patient taking his medications this morning (9/9).  SLP secure message chatted with MD who reported that GI not currently recommending EGD and both MD and SLP in agreement to pursue MBS on Monday 12/19/21. SLP Visit Diagnosis: Dysphagia, unspecified (R13.10)    Aspiration Risk  Mild aspiration risk    Diet Recommendation Regular;Thin liquid   Liquid Administration via: Cup;Straw Medication Administration: Whole meds with liquid Supervision: Patient able to self feed Compensations: Slow rate;Small sips/bites Postural Changes: Seated upright at 90 degrees;Remain upright for at least 30 minutes after po intake    Other  Recommendations Oral Care Recommendations: Oral care BID    Recommendations for follow up therapy are one component of a multi-disciplinary discharge planning process, led by the attending physician.  Recommendations may be updated based on patient status, additional functional criteria and insurance authorization.  Follow up Recommendations Other (comment) (TBD)      Assistance Recommended at Discharge Intermittent Supervision/Assistance  Functional Status Assessment Patient has had a recent decline in their functional status and demonstrates the ability to make significant improvements in function in a reasonable and predictable amount of time.  Frequency and Duration min 1 x/week  1 week       Prognosis Prognosis for Safe Diet Advancement: Good      Swallow Study   General Date of Onset: 12/19/21 HPI: Patient is a 76 y.o. male with PMH of HTN who presented to the hospital from home on 12/18/2021 due to 4 day h/o  productive cough, new bilateral lower extremity edema for past 3 weeks. Over the past 4 weeks he has had a gradual decline in his memory and overall health, and has become more sedantary. Of  note, he has had several life changing events such as death of his brother in 08-13-2020, death of his sister from bone cancer in 14-Aug-2019 and recent separation from his wife. He currently lives with his daughter. Work-up in ED revealed FTT, mild cognitive deficit and concern for aspiration PNA. In addition, possible undiagnosed depression. CT angio chest pulmonary PE was negative for PE but "Patulous esophagus containing air-fluid level up to the upper-mid esophagus; Pulmonary findings consistent with small airway infection/inflammation. Right lower lobe opacities consistent with pneumonia/pneumonitis. Given the patulous esophagus containing layering fluid this may be related to aspiration." Type of Study: Bedside Swallow Evaluation Previous Swallow Assessment: none found Diet Prior to this Study: Regular;Thin liquids Temperature Spikes Noted: No Respiratory Status: Room air History of Recent Intubation: No Behavior/Cognition: Alert;Cooperative;Pleasant mood Oral Cavity Assessment: Within Functional Limits Oral Care Completed by SLP: No Oral Cavity - Dentition: Adequate natural dentition Vision: Functional for self-feeding Self-Feeding Abilities: Able to feed self Patient Positioning: Upright in bed Baseline Vocal Quality: Normal Volitional Cough: Congested;Strong Volitional Swallow: Able to elicit    Oral/Motor/Sensory Function Overall Oral Motor/Sensory Function: Mild impairment Facial ROM: Within Functional Limits Facial Symmetry: Abnormal symmetry left Facial Strength: Reduced left Lingual ROM: Within Functional Limits Lingual Symmetry: Within Functional Limits   Ice Chips     Thin Liquid Thin Liquid: Impaired Presentation: Straw;Self Fed Pharyngeal  Phase Impairments: Cough - Immediate Other Comments: immediate cough with first sip of thin liquids but no further overt s/s with subsequent sips    Nectar Thick     Honey Thick     Puree Puree: Not tested Other Comments: patient declining  any solids at this time   Solid     Solid: Not tested Other Comments: patient declining any solids at this time      Angela Nevin, MA, CCC-SLP Speech Therapy

## 2021-12-19 NOTE — Evaluation (Signed)
Occupational Therapy Evaluation Patient Details Name: Kenneth Benitez MRN: [id number] DOB: 23-Feb-1946 Today's Date: 12/19/2021   History of Present Illness Kenneth Benitez is a 76 y.o. male with medical history significant for hypertension, who presented from home where he lives with his sister, due to gradual decline in his overall health for the past 4 weeks.  Associated with generalized fatigue, a decline in his memory, and a sedentary lifestyle. His mood has been depressed, recently separated from his wife, multiple deaths in the family, lost his brother in 2022 and his sister to bone cancer in 2021.   Clinical Impression   PTA, pt lived with his sister and was independent in ADL. Pt reporting he was independent in medication management and driving, but unsure of accuracy of pt report as admitted with continued cognitive/physical decline. Upon eval, pt requiring min A for LB ADL and min guard for seated UB ADL. Pt requiring min A for grooming tasks while standing. Pt with generalized weakness, decreased activity tolerance, balance, problem solving, and memory. Pt requiring min A +2 for functional mobility with or without RW, and observed with decreased stride length. Recommending skilled OT at SNF to optimize safety and independence in ADL and IADL. Will continue to follow acutely.      Recommendations for follow up therapy are one component of a multi-disciplinary discharge planning process, led by the attending physician.  Recommendations may be updated based on patient status, additional functional criteria and insurance authorization.   Follow Up Recommendations  Skilled nursing-short term rehab (<3 hours/day)    Assistance Recommended at Discharge Frequent or constant Supervision/Assistance  Patient can return home with the following A little help with walking and/or transfers;A little help with bathing/dressing/bathroom;Assistance with cooking/housework;Direct supervision/assist for  medications management;Direct supervision/assist for financial management;Assist for transportation;Help with stairs or ramp for entrance    Functional Status Assessment  Patient has had a recent decline in their functional status and/or demonstrates limited ability to make significant improvements in function in a reasonable and predictable amount of time  Equipment Recommendations  Other (comment) (defer to next venue)    Recommendations for Other Services Speech consult (expressive difficulties)     Precautions / Restrictions Precautions Precautions: Fall Precaution Comments: Consider getting a better pulse ox finger connection; O2 sat reading varied widely (often with a poor pleth wave); pt appeared not in distress Restrictions Weight Bearing Restrictions: No      Mobility Bed Mobility Overal bed mobility: Needs Assistance Bed Mobility: Supine to Sit, Sit to Supine     Supine to sit: Min assist Sit to supine: Min assist   General bed mobility comments: Incr time and multiple cues to intiate, and to follow through to sitting fully upright with feet on the floor    Transfers Overall transfer level: Needs assistance Equipment used: 2 person hand held assist, Rolling walker (2 wheels) Transfers: Sit to/from Stand Sit to Stand: Min assist, +2 safety/equipment           General transfer comment: Min assis to steady; overall adequate strength to power up, but needing to brace backs of LEs against bed for stability      Balance Overall balance assessment: Needs assistance Sitting-balance support: Feet supported Sitting balance-Leahy Scale: Fair Sitting balance - Comments: able to perform figure 4 sitting EOB with increased time     Standing balance-Leahy Scale: Poor Standing balance comment: Braced backs of LEs against stable surface for stability -- indicative of increased fall risk  ADL either performed or assessed with clinical  judgement   ADL Overall ADL's : Needs assistance/impaired Eating/Feeding: Set up;Bed level   Grooming: Wash/dry face;Min guard;Minimal assistance;Standing Grooming Details (indicate cue type and reason): Min guard/intermittent min A for washing face standing at sink. Pt frequently bracing self on sink with UE, but not leaning over sink Upper Body Bathing: Min guard;Sitting   Lower Body Bathing: Minimal assistance;Sit to/from stand   Upper Body Dressing : Set up;Sitting   Lower Body Dressing: Min guard;Sitting/lateral leans Lower Body Dressing Details (indicate cue type and reason): Donning socks sitting EOB with significantly increased time. Toilet Transfer: Minimal assistance;+2 for physical assistance;Comfort height toilet;Ambulation Toilet Transfer Details (indicate cue type and reason): Simulated in room         Functional mobility during ADLs: Minimal assistance;+2 for physical assistance General ADL Comments: Pt limited by strength, endurance, and activity tolerance. Festingating/waddle gait     Vision Baseline Vision/History: 0 No visual deficits Patient Visual Report: No change from baseline Additional Comments: Pt denying any changes in vision. Continue to assess     Perception     Praxis      Pertinent Vitals/Pain Pain Assessment Pain Assessment: No/denies pain     Hand Dominance     Extremity/Trunk Assessment Upper Extremity Assessment Upper Extremity Assessment: Generalized weakness (undershooting once, but accuracy following first trial. Unsure if fluke. Pt denying changes in vision. Decreased ROM in Bil shoulders and neck)   Lower Extremity Assessment Lower Extremity Assessment: Defer to PT evaluation   Cervical / Trunk Assessment Cervical / Trunk Assessment: Other exceptions Cervical / Trunk Exceptions: head and shoulders forward posture   Communication Communication Communication: Expressive difficulties;Other (comment) (difficult to understand at  times.)   Cognition Arousal/Alertness: Awake/alert Behavior During Therapy: WFL for tasks assessed/performed (emotional and tearful throughout session) Overall Cognitive Status: No family/caregiver present to determine baseline cognitive functioning Area of Impairment: Following commands, Safety/judgement, Awareness, Problem solving                       Following Commands: Follows one step commands with increased time Safety/Judgement: Decreased awareness of safety, Decreased awareness of deficits Awareness: Intellectual Problem Solving: Decreased initiation, Difficulty sequencing, Slow processing, Requires verbal cues, Requires tactile cues General Comments: Pt oriented to place, person, and day. Pt demonstrates awareness of physical and cognitive changes. Pt with max difficulty counting backward in minimally distracting environment (20, 19, 17, 15 tearful without cueing for accuracy) and becoming tearful, so OT redirecting to another topic. Pt tearful during cognitive tasks and mobility. Able to sequence face washing at sink (turning on water, washing, etc)     General Comments  Session conducted in collaboration with PT for safety. Pt on room air and O2 with poor pleth, but with good PLETH, consistently in 90's and pt not demonstrating signs of increased WOB or distress. Pt tearful throughout session, but not verbalizing regarding emotions. OT/PT providing emotional support throughout    Exercises     Shoulder Instructions      Home Living Family/patient expects to be discharged to:: Private residence Living Arrangements: Other relatives (sister) Available Help at Discharge: Family Type of Home: House Home Access: Stairs to enter Secretary/administrator of Steps: 2 Entrance Stairs-Rails: Can reach both Home Layout: One level     Bathroom Shower/Tub: Walk-in shower;Door   [employer] Toilet: Standard                Prior Functioning/Environment Prior Level of  Function : Independent/Modified Independent             Mobility Comments: Reports he walks without an assistive device ADLs Comments: Reports he was independent in ADL, IADL, and driving. Daughter and niece help with cooking and cleaning (daughter doesn't live with him). Pt reports independent med management.        OT Problem List: Decreased strength;Decreased activity tolerance;Impaired balance (sitting and/or standing);Decreased coordination;Decreased cognition;Decreased safety awareness;Decreased knowledge of use of DME or AE      OT Treatment/Interventions: Self-care/ADL training;Therapeutic exercise;DME and/or AE instruction;Therapeutic activities;Cognitive remediation/compensation;Patient/family education;Balance training    OT Goals(Current goals can be found in the care plan section) Acute Rehab OT Goals Patient Stated Goal: None stated OT Goal Formulation: With patient Time For Goal Achievement: 01/02/22 Potential to Achieve Goals: Good  OT Frequency: Min 2X/week    Co-evaluation PT/OT/SLP Co-Evaluation/Treatment: Yes Reason for Co-Treatment: For patient/therapist safety;To address functional/ADL transfers PT goals addressed during session: Mobility/safety with mobility;Balance;Proper use of DME OT goals addressed during session: ADL's and self-care;Proper use of Adaptive equipment and DME      AM-PAC OT "6 Clicks" Daily Activity     Outcome Measure Help from another person eating meals?: None Help from another person taking care of personal grooming?: A Little Help from another person toileting, which includes using toliet, bedpan, or urinal?: A Little Help from another person bathing (including washing, rinsing, drying)?: A Little Help from another person to put on and taking off regular upper body clothing?: A Little Help from another person to put on and taking off regular lower body clothing?: A Little 6 Click Score: 19   End of Session Equipment Utilized  During Treatment: Gait belt;Rolling walker (2 wheels) Nurse Communication: Mobility status  Activity Tolerance: Patient tolerated treatment well Patient left: in bed;with call bell/phone within reach;Other (comment) (Echo in room; reminding echo to set bed alarm prior to departure)  OT Visit Diagnosis: Unsteadiness on feet (R26.81);Other abnormalities of gait and mobility (R26.89);Muscle weakness (generalized) (M62.81)                Time: 4967-5916 OT Time Calculation (min): 32 min Charges:  OT General Charges $OT Visit: 1 Visit OT Evaluation $OT Eval Moderate Complexity: 1 Mod  Ladene Artist, OTR/L [facility] Acute Rehabilitation Office: [phone]   Drue Novel 12/19/2021, 1:30 PM

## 2021-12-19 NOTE — Assessment & Plan Note (Signed)
-   Patient seen by psychiatry service -Recommending outpatient follow-up with psychiatry. -Low-dose Remeron will be started. -Continue supportive care and consult reorientation.

## 2021-12-19 NOTE — Progress Notes (Signed)
  Echocardiogram 2D Echocardiogram has been performed.  Delcie Roch 12/19/2021, 10:19 AM

## 2021-12-19 NOTE — Evaluation (Signed)
Physical Therapy Evaluation Patient Details Name: Kenneth Benitez MRN: [id number] DOB: 1945/04/15 Today's Date: 12/19/2021  History of Present Illness  Kenneth Benitez is a 76 y.o. male with medical history significant for hypertension, who presented from home where he lives with his sister, due to gradual decline in his overall health for the past 4 weeks.  Associated with generalized fatigue, a decline in his memory, and a sedentary lifestyle. His mood has been depressed, recently separated from his wife, multiple deaths in the family, lost his brother in 2022 and his sister to bone cancer in 2021.  Clinical Impression   Pt admitted with above diagnosis. Lives at home with his sister (his daughter and neice assist with cooking, cleaning, home mgmnt), in a single-level home with 2 steps to enter; Pt tells [place] he typically walks without an assistive device, and is still driving (worth considering verifying with family); noted in H&P the in the weeks prior to admission, pt was experiencing a decline in function; Presents to PT with generalized weakness and decr activity tolerance, balance deficits and incr fall risk, gait pattern resembling festination; Initially hesitant to try RW, but he did try it and bil UE support helped with gait steadiness; He is motivated to improve, and shows good rehab potential;  Pt currently with functional limitations due to the deficits listed below (see PT Problem List). Pt will benefit from skilled PT to increase their independence and safety with mobility to allow discharge to the venue listed below.          Recommendations for follow up therapy are one component of a multi-disciplinary discharge planning process, led by the attending physician.  Recommendations may be updated based on patient status, additional functional criteria and insurance authorization.  Follow Up Recommendations Skilled nursing-short term rehab (<3 hours/day) Can patient physically be  transported by private vehicle: Yes    Assistance Recommended at Discharge Intermittent Supervision/Assistance  Patient can return home with the following  A lot of help with walking and/or transfers;Two people to help with walking and/or transfers;Help with stairs or ramp for entrance    Equipment Recommendations Rolling walker (2 wheels);BSC/3in1;Wheelchair (measurements PT);Wheelchair cushion (measurements PT)  Recommendations for Other Services       Functional Status Assessment Patient has had a recent decline in their functional status and demonstrates the ability to make significant improvements in function in a reasonable and predictable amount of time.     Precautions / Restrictions Precautions Precautions: Fall Precaution Comments: Consider getting a better pulse ox finger connection; O2 sat reading varied widely (often with a poor pleth wave); pt appeared not in deistress Restrictions Weight Bearing Restrictions: No      Mobility  Bed Mobility Overal bed mobility: Needs Assistance Bed Mobility: Supine to Sit     Supine to sit: Min assist     General bed mobility comments: Incr time and multiple cues to intiate, and to follow through to sitting fully upright with feet on the floor    Transfers Overall transfer level: Needs assistance Equipment used: 2 person hand held assist Transfers: Sit to/from Stand Sit to Stand: Min assist, +2 safety/equipment           General transfer comment: Min assis to steady; overall adequate strength to power up, but needing to brace backs of LEs against bed for stability    Ambulation/Gait Ambulation/Gait assistance: Mod assist, +2 safety/equipment Gait Distance (Feet): 18 Feet (6+12; to sink, then sink to other side of bed) Assistive  device: 2 person hand held assist, Rolling walker (2 wheels) Gait Pattern/deviations: Wide base of support, Festinating, Decreased weight shift to right, Decreased weight shift to left,  Decreased step length - right, Decreased step length - left, Decreased stance time - right, Decreased stance time - left Gait velocity: very slow     General Gait Details: Wide BOS, with decr ability to weight shift fully into single limb stance, leading to decr step length and height and a pattern resembling festination  Stairs            Wheelchair Mobility    Modified Rankin (Stroke Patients Only)       Balance Overall balance assessment: Needs assistance Sitting-balance support: Feet supported Sitting balance-Leahy Scale: Fair       Standing balance-Leahy Scale: Poor Standing balance comment: Braced backs of LEs against stable surface for stability -- indicative of increased fall risk                             Pertinent Vitals/Pain Pain Assessment Pain Assessment: No/denies pain    Home Living Family/patient expects to be discharged to:: Private residence Living Arrangements: Other relatives (sister) Available Help at Discharge: Family Type of Home: House Home Access: Stairs to enter Entrance Stairs-Rails: Can reach both Entrance Stairs-Number of Steps: 2   Home Layout: One level        Prior Function Prior Level of Function : Independent/Modified Independent             Mobility Comments: Reports he walks without an assistive device ADLs Comments: Reports he was independent in ADL, IADL, and driving. Daughter and niece help with cooking and cleaning (daughter doesn't live with him). Pt reports independent med management.     Hand Dominance        Extremity/Trunk Assessment   Upper Extremity Assessment Upper Extremity Assessment: Defer to OT evaluation    Lower Extremity Assessment Lower Extremity Assessment: Generalized weakness (Dyscoordination with amb trial, with decr weight shift fully into stance R and LLEs)    Cervical / Trunk Assessment Cervical / Trunk Assessment: Other exceptions Cervical / Trunk Exceptions: head  and shoulders forward posture  Communication   Communication: Expressive difficulties;Other (comment) (at times difficulty to understand)  Cognition Arousal/Alertness: Awake/alert Behavior During Therapy: WFL for tasks assessed/performed (Emotional and tearful during session) Overall Cognitive Status: No family/caregiver present to determine baseline cognitive functioning Area of Impairment: Following commands, Safety/judgement, Awareness, Problem solving                       Following Commands: Follows one step commands with increased time Safety/Judgement: Decreased awareness of safety, Decreased awareness of deficits Awareness: Intellectual Problem Solving: Decreased initiation, Difficulty sequencing, Slow processing, Requires verbal cues, Requires tactile cues          General Comments General comments (skin integrity, edema, etc.): Session conducted on room air and NAD noted (O2 sats varied, when pleth wave was good, tended to be in the 90s); became emotional during session, crying    Exercises     Assessment/Plan    PT Assessment Patient needs continued PT services  PT Problem List Decreased range of motion;Decreased strength;Decreased activity tolerance;Decreased balance;Decreased mobility;Decreased coordination;Decreased cognition;Decreased knowledge of use of DME;Decreased safety awareness;Decreased knowledge of precautions       PT Treatment Interventions DME instruction;Gait training;Stair training;Functional mobility training;Therapeutic activities;Therapeutic exercise;Balance training;Patient/family education;Neuromuscular re-education;Cognitive remediation    PT Goals (Current  goals can be found in the Care Plan section)  Acute Rehab PT Goals Patient Stated Goal: Did not specifically state, but agreeable to getting up PT Goal Formulation: With patient Time For Goal Achievement: 01/02/22 Potential to Achieve Goals: Good    Frequency Min 3X/week      Co-evaluation PT/OT/SLP Co-Evaluation/Treatment: Yes Reason for Co-Treatment: For patient/therapist safety;To address functional/ADL transfers           AM-PAC PT "6 Clicks" Mobility  Outcome Measure Help needed turning from your back to your side while in a flat bed without using bedrails?: A Little Help needed moving from lying on your back to sitting on the side of a flat bed without using bedrails?: A Little Help needed moving to and from a bed to a chair (including a wheelchair)?: A Lot Help needed standing up from a chair using your arms (e.g., wheelchair or bedside chair)?: A Lot Help needed to walk in hospital room?: A Lot Help needed climbing 3-5 steps with a railing? : A Lot 6 Click Score: 14    End of Session Equipment Utilized During Treatment: Gait belt Activity Tolerance: Patient tolerated treatment well Patient left: in bed;with call bell/phone within reach;Other (comment) (handed off care to Echocardiogram)   PT Visit Diagnosis: Unsteadiness on feet (R26.81);Other abnormalities of gait and mobility (R26.89);Difficulty in walking, not elsewhere classified (R26.2);Other symptoms and signs involving the nervous system (R29.898)    Time: 9528-4132 PT Time Calculation (min) (ACUTE ONLY): 32 min   Charges:   PT Evaluation $PT Eval Moderate Complexity: 1 Mod          Van Clines, PT  Acute Rehabilitation Services Office [phone]   Levi Aland 12/19/2021, 11:17 AM

## 2021-12-20 DIAGNOSIS — J189 Pneumonia, unspecified organism: Secondary | ICD-10-CM | POA: Diagnosis not present

## 2021-12-20 LAB — BASIC METABOLIC PANEL
Anion gap: 11 (ref 5–15)
BUN: 19 mg/dL (ref 8–23)
CO2: 27 mmol/L (ref 22–32)
Calcium: 9.1 mg/dL (ref 8.9–10.3)
Chloride: 102 mmol/L (ref 98–111)
Creatinine, Ser: 1.41 mg/dL — ABNORMAL HIGH (ref 0.61–1.24)
GFR, Estimated: 52 mL/min — ABNORMAL LOW (ref >60)
Glucose, Bld: 81 mg/dL (ref 70–99)
Potassium: 4.7 mmol/L (ref 3.5–5.1)
Sodium: 140 mmol/L (ref 135–145)

## 2021-12-20 NOTE — Progress Notes (Signed)
PROGRESS NOTE    Kenneth Benitez  DZH:[id number] DOB: April 24, 1945 DOA: 12/18/2021 PCP: Quitman Livings, MD  Brief Narrative:  As per H&P written by Dr. Margo Aye on 12/18/2021 Kenneth Benitez is a 76 y.o. male with medical history significant for hypertension, who presented from home where he lives with his sister, due to gradual decline in his overall health for the past 4 weeks.  Associated with generalized fatigue, a decline in his memory, and a sedentary lifestyle.  Endorses developing a productive cough about 4 days ago.  No subjective fevers or chills.  Reports new bilateral lower extremity edema for the past 3 weeks.  No chest pain.  No GI symptoms no focal motor deficits.  His mood has been depressed, recently separated from his wife, multiple deaths in the family, lost his brother in 2022 and his sister to bone cancer in 2021.     He was initially seen at the [facility] earlier today where he had a chest x-ray done.  Due to slow updates from the treatment team at the [facility] hospital, per the patient's sister, he was brought in to [facility], ED for further evaluation.     Work-up in the ED revealed failure to thrive, mild cognitive deficit, and concern for aspiration pneumonia.  Also possible undiagnosed depression.  Tearful in the room.  Agrees to see psych inpatient.  The patient was started on empiric IV antibiotics.  First set of high-sensitivity troponin and BNP were within normal limits.  CT angio was negative for pulmonary embolism.  TRH, hospitalist service, was asked to admit. Assessment & Plan:   Principal Problem:   CAP (community acquired pneumonia) Active Problems:   Current moderate episode of major depressive disorder without prior episode (HCC)  CAP (community acquired pneumonia)-concern for aspiration pneumonia.  Patient now on room air.  Continue bronchodilators encouraged to use incentive spirometer and flutter valves.finish course of antibiotics.   Current moderate episode of  major depressive disorder without prior episode [facility]) - Patient seen by psychiatry service -Recommending outpatient follow-up with psychiatry. Continue low-dose Remeron.   Hypertension continue Norvasc 5 mg daily. No prior history of hypertension possibly undiagnosed. Will need to increase the dose if blood pressure not controlled.  Continue as needed hydralazine.  Blood pressure 187/91.   Hypokalemia resolved.  Mag 1.8..   Bilateral lower extremity edema/grade 1 diastolic dysfunction -TSH within normal limits, albumin 3.1 -2D echo with EF 55 to 60%, grade 1 diastolic dysfunction  Low-dose Lasix added by the prior provider.  Failure to thrive -Treatment in the setting of underlying depression and decreased oral intake -Continue feeding supplements and supportive care -Low-dose Remeron has been started -Check thiamine and B12 levels.   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    Nutrition Problem: Inadequate oral intake Etiology: lethargy/confusion, acute illness     Signs/Symptoms: per patient/family report    Interventions: Ensure Enlive (each supplement provides 350kcal and 20 grams of protein), MVI  Estimated body mass index is 24.47 kg/m as calculated from the following:   Height as of this encounter: 5\' 5"  (1.651 m).   Weight as of this encounter: 66.7 kg.  DVT prophylaxis: Lovenox  code Status: Full code Family Communication: None at bedside  disposition Plan:  Status is: Inpatient Remains inpatient appropriate because: Await SNF   Consultants:  None  Procedures: None Antimicrobials: Unasyn Subjective: Patient is resting in bed No concerns overnight  Objective: Vitals:   12/20/21 1000 12/20/21 1100 12/20/21 1200 12/20/21 1237  BP:   (!) 187/91   Pulse: (!) 168  67 74   Resp: (!) 21 18 18 19   Temp:    98.4 F (36.9 C)  TempSrc:    Oral  SpO2: (!) 59%  97% 95%  Weight:      Height:        Intake/Output Summary (Last 24 hours) at 12/20/2021 1306 Last data filed at 12/20/2021 1200 Gross per 24 hour  Intake 555 ml  Output 2200 ml  Net -1645 ml   Filed Weights   12/18/21 2011 12/19/21 0227 12/20/21 0409  Weight: 75.8 kg 69 kg 66.7 kg    Examination:  General exam: Appears calm and comfortable  Respiratory system: Few rhonchi to auscultation. Respiratory effort normal. Cardiovascular system: S1 & S2 heard, RRR. No JVD, murmurs, rubs, gallops or clicks. No pedal edema. Gastrointestinal system: Abdomen is nondistended, soft and nontender. No organomegaly or masses felt. Normal bowel sounds heard. Central nervous system: Alert and oriented. No focal neurological deficits. Extremities: 1+ edema bilaterally. Skin: No rashes, lesions or ulcers Psychiatry: Judgement and insight appear normal. Mood & affect appropriate.     Data Reviewed: I have personally reviewed following labs and imaging studies  CBC: Recent Labs  Lab 12/18/21 1251 12/19/21 1520  WBC 12.9* 10.4  NEUTROABS 9.2* 7.1  HGB 13.7 12.1*  HCT 43.2 36.0*  MCV 82.6 79.5*  PLT 246 [id number]   Basic Metabolic Panel: Recent Labs  Lab 12/18/21 1251 12/19/21 1520 12/20/21 0226  NA 143 141 140  K 3.0* 3.3* 4.7  CL 105 103 102  CO2 27 28 27   GLUCOSE 79 103* 81  BUN 12 10 19   CREATININE 1.42* 1.36* 1.41*  CALCIUM 9.9 9.0 9.1  MG 2.0 1.8  --   PHOS  --  2.8  --    GFR: Estimated Creatinine Clearance: 39.4 mL/min (A) (by C-G formula based on SCr of 1.41 mg/dL (H)). Liver Function Tests: Recent Labs  Lab 12/18/21 1251 12/19/21 1520  AST 24 22  ALT 23 18  ALKPHOS 78 55  BILITOT 0.4 0.7  PROT 7.9 6.2*  ALBUMIN 4.1 3.1*   No results for input(s): "LIPASE", "AMYLASE" in the last 168 hours. Recent Labs  Lab 12/18/21 1803  AMMONIA 19   Coagulation Profile: No results for  input(s): "INR", "PROTIME" in the last 168 hours. Cardiac Enzymes: No results for input(s): "CKTOTAL", "CKMB", "CKMBINDEX", "TROPONINI" in the last 168 hours. BNP (last 3 results) No results for input(s): "PROBNP" in the last 8760 hours. HbA1C: No results for input(s): "HGBA1C" in the last 72 hours. CBG: Recent Labs  Lab 12/18/21 1701  GLUCAP 78   Lipid Profile: No results for input(s): "CHOL", "HDL", "LDLCALC", "TRIG", "CHOLHDL", "LDLDIRECT" in the last 72 hours. Thyroid Function Tests: Recent Labs    12/19/21 1520  TSH 1.703   Anemia Panel: No results for input(s): "VITAMINB12", "FOLATE", "FERRITIN", "TIBC", "IRON", "RETICCTPCT" in the last 72 hours. Sepsis Labs: Recent Labs  Lab 12/18/21 1803 12/19/21 1520  PROCALCITON  --  <0.10  LATICACIDVEN 1.2  --     Recent Results (from the past 240 hour(s))  Urine Culture     Status: None   Collection Time: 12/18/21 12:51 PM   Specimen: Urine, Clean Catch  Result Value Ref Range Status   Specimen Description URINE,  CLEAN CATCH  Final   Special Requests NONE  Final   Culture   Final    NO GROWTH Performed at [facility], [place] [address] [place], [place] [address]    Report Status 12/19/2021 FINAL  Final  Resp Panel by RT-PCR (Flu A&B, Covid) Anterior Nasal Swab     Status: None   Collection Time: 12/18/21 12:53 PM   Specimen: Anterior Nasal Swab  Result Value Ref Range Status   SARS Coronavirus 2 by RT PCR NEGATIVE NEGATIVE Final    Comment: (NOTE) SARS-CoV-2 target nucleic acids are NOT DETECTED.  The SARS-CoV-2 RNA is generally detectable in upper respiratory specimens during the acute phase of infection. The lowest concentration of SARS-CoV-2 viral copies this assay can detect is 138 copies/mL. A negative result does not preclude SARS-Cov-2 infection and should not be used as the sole basis for treatment or other patient management decisions. A negative result may occur with  improper specimen  collection/handling, submission of specimen other than nasopharyngeal swab, presence of viral mutation(s) within the areas targeted by this assay, and inadequate number of viral copies(<138 copies/mL). A negative result must be combined with clinical observations, patient history, and epidemiological information. The expected result is Negative.  Fact Sheet for Patients:  EntrepreneurPulse.com.au  Fact Sheet for Healthcare Providers:  IncredibleEmployment.be  This test is no t yet approved or cleared by the [place] FDA and  has been authorized for detection and/or diagnosis of SARS-CoV-2 by FDA under an Emergency Use Authorization (EUA). This EUA will remain  in effect (meaning this test can be used) for the duration of the COVID-19 declaration under Section 564(b)(1) of the Act, 21 U.S.C.section 360bbb-3(b)(1), unless the authorization is terminated  or revoked sooner.       Influenza A by PCR NEGATIVE NEGATIVE Final   Influenza B by PCR NEGATIVE NEGATIVE Final    Comment: (NOTE) The Xpert Xpress SARS-CoV-2/FLU/RSV plus assay is intended as an aid in the diagnosis of influenza from Nasopharyngeal swab specimens and should not be used as a sole basis for treatment. Nasal washings and aspirates are unacceptable for Xpert Xpress SARS-CoV-2/FLU/RSV testing.  Fact Sheet for Patients: EntrepreneurPulse.com.au  Fact Sheet for Healthcare Providers: IncredibleEmployment.be  This test is not yet approved or cleared by the [place] FDA and has been authorized for detection and/or diagnosis of SARS-CoV-2 by FDA under an Emergency Use Authorization (EUA). This EUA will remain in effect (meaning this test can be used) for the duration of the COVID-19 declaration under Section 564(b)(1) of the Act, 21 U.S.C. section 360bbb-3(b)(1), unless the authorization is terminated or revoked.  Performed at [facility], [place] [address] [place],  [address]          Radiology Studies: ECHOCARDIOGRAM COMPLETE  Result Date: 12/19/2021    ECHOCARDIOGRAM REPORT   Patient Name:   Kenneth Benitez Date of Exam: 12/19/2021 Medical Rec #:  [id number]         Height:       65.0 in Accession #:    [id number]        Weight:       152.1 lb Date of Birth:  Jul 07, 1945        BSA:          1.761 m Patient Age:    59 years          BP:           164/97 mmHg Patient Gender: M  HR:           78 bpm. Exam Location:  Inpatient Procedure: 2D Echo Indications:    abnormal ecg  History:        Patient has no prior history of Echocardiogram examinations.                 Risk Factors:Current Smoker.  Sonographer:    Johny Chess RDCS Referring Phys: [id number] [place]  1. Left ventricular ejection fraction, by estimation, is 55 to 60%. The left ventricle has normal function. Left ventricular endocardial border not optimally defined to evaluate regional wall motion. Left ventricular diastolic parameters are consistent with Grade I diastolic dysfunction (impaired relaxation).  2. Right ventricular systolic function is normal. The right ventricular size is normal. Tricuspid regurgitation signal is inadequate for assessing PA pressure.  3. The mitral valve is normal in structure. No evidence of mitral valve regurgitation. No evidence of mitral stenosis.  4. The aortic valve has an indeterminant number of cusps. Aortic valve regurgitation is moderate.  5. The inferior vena cava is normal in size with greater than 50% respiratory variability, suggesting right atrial pressure of 3 mmHg. FINDINGS  Left Ventricle: Left ventricular ejection fraction, by estimation, is 55 to 60%. The left ventricle has normal function. Left ventricular endocardial border not optimally defined to evaluate regional wall motion. The left ventricular internal cavity size was normal in size. There is no left ventricular  hypertrophy. Left ventricular diastolic parameters are consistent with Grade I diastolic dysfunction (impaired relaxation). Normal left ventricular filling pressure. Right Ventricle: The right ventricular size is normal. Right vetricular wall thickness was not well visualized. Right ventricular systolic function is normal. Tricuspid regurgitation signal is inadequate for assessing PA pressure. Left Atrium: Left atrial size was normal in size. Right Atrium: Right atrial size was normal in size. Pericardium: There is no evidence of pericardial effusion. Mitral Valve: The mitral valve is normal in structure. No evidence of mitral valve regurgitation. No evidence of mitral valve stenosis. Tricuspid Valve: The tricuspid valve is normal in structure. Tricuspid valve regurgitation is not demonstrated. No evidence of tricuspid stenosis. Aortic Valve: The aortic valve has an indeterminant number of cusps. Aortic valve regurgitation is moderate. Aortic regurgitation PHT measures 492 msec. Pulmonic Valve: The pulmonic valve was not well visualized. Pulmonic valve regurgitation is not visualized. No evidence of pulmonic stenosis. Aorta: The aortic root is normal in size and structure. Venous: The inferior vena cava is normal in size with greater than 50% respiratory variability, suggesting right atrial pressure of 3 mmHg. IAS/Shunts: No atrial level shunt detected by color flow Doppler.  LEFT VENTRICLE PLAX 2D LVIDd:         3.90 cm   Diastology LVIDs:         2.70 cm   LV e' medial:    8.38 cm/s LV PW:         1.00 cm   LV E/e' medial:  7.3 LV IVS:        0.90 cm   LV e' lateral:   10.10 cm/s LVOT diam:     2.10 cm   LV E/e' lateral: 6.0 LV SV:         55 LV SV Index:   31 LVOT Area:     3.46 cm  RIGHT VENTRICLE             IVC RV S prime:     13.50 cm/s  IVC diam: 1.90 cm TAPSE (  M-mode): 2.0 cm LEFT ATRIUM             Index        RIGHT ATRIUM          Index LA diam:        2.90 cm 1.65 cm/m   RA Area:     9.36 cm LA Vol  (A2C):   31.3 ml 17.77 ml/m  RA Volume:   18.70 ml 10.62 ml/m LA Vol (A4C):   33.4 ml 18.97 ml/m LA Biplane Vol: 34.9 ml 19.82 ml/m  AORTIC VALVE LVOT Vmax:   99.30 cm/s LVOT Vmean:  61.900 cm/s LVOT VTI:    0.158 m AI PHT:      492 msec  AORTA Ao Root diam: 3.10 cm MITRAL VALVE MV Area (PHT): 4.06 cm    SHUNTS MV Decel Time: 187 msec    Systemic VTI:  0.16 m MV E velocity: 60.80 cm/s  Systemic Diam: 2.10 cm MV A velocity: 82.40 cm/s MV E/A ratio:  0.74 Carlyle Dolly MD Electronically signed by Carlyle Dolly MD Signature Date/Time: 12/19/2021/11:29:08 AM    Final    CT Angio Chest PE W and/or Wo Contrast  Result Date: 12/18/2021 CLINICAL DATA:  High probability pulmonary embolism; decreased sensation in lower extremities; decreasing cognitive function EXAM: CT ANGIOGRAPHY CHEST WITH CONTRAST TECHNIQUE: Multidetector CT imaging of the chest was performed using the standard protocol during bolus administration of intravenous contrast. Multiplanar CT image reconstructions and MIPs were obtained to evaluate the vascular anatomy. RADIATION DOSE REDUCTION: This exam was performed according to the departmental dose-optimization program which includes automated exposure control, adjustment of the mA and/or kV according to patient size and/or use of iterative reconstruction technique. CONTRAST:  152mL OMNIPAQUE IOHEXOL 350 MG/ML SOLN COMPARISON:  None Available. FINDINGS: Cardiovascular: Satisfactory opacification of the pulmonary arteries to the segmental level. No evidence of pulmonary embolism. Normal heart size. No pericardial effusion. Mild aortic atherosclerosis. Mediastinum/Nodes: Patulous esophagus containing air-fluid level up to the upper-mid esophagus. No thoracic adenopathy by size. Unremarkable trachea. Lungs/Pleura: Centrilobular emphysema. Numerous bilateral centrilobular micro nodules and tree in bud opacities with more confluence ground-glass opacities in the posterior right lower lobe. Mild  diffuse bronchial wall thickening. Upper Abdomen: 1.0 cm short axis lymph node in the upper abdomen is favored reactive but technically indeterminate. No acute abnormality in the visualized abdomen. Musculoskeletal: No chest wall abnormality. No acute osseous findings. Review of the MIP images confirms the above findings. IMPRESSION: 1. Negative for acute pulmonary embolism. 2. Pulmonary findings consistent with small airway infection/inflammation. Right lower lobe opacities consistent with pneumonia/pneumonitis. Given the patulous esophagus containing layering fluid this may be related to aspiration. 3. Aortic Atherosclerosis (ICD10-I70.0) and Emphysema (ICD10-J43.9). Electronically Signed   By: Placido Sou M.D.   On: 12/18/2021 19:21   CT Head Wo Contrast  Result Date: 12/18/2021 CLINICAL DATA:  Altered mental status EXAM: CT HEAD WITHOUT CONTRAST TECHNIQUE: Contiguous axial images were obtained from the base of the skull through the vertex without intravenous contrast. RADIATION DOSE REDUCTION: This exam was performed according to the departmental dose-optimization program which includes automated exposure control, adjustment of the mA and/or kV according to patient size and/or use of iterative reconstruction technique. COMPARISON:  05/22/2021 FINDINGS: Brain: No acute intracranial findings are seen in noncontrast CT brain. There are no signs of bleeding within the cranium. Cortical sulci are prominent. There is decreased density in periventricular and subcortical white matter. Vascular: Unremarkable. Skull: Unremarkable. Sinuses/Orbits: There is opacification of  left maxillary sinus. There is medial bulging of medial wall of left maxillary antrum. These findings appear stable. Other: None. IMPRESSION: No acute intracranial findings are seen in noncontrast CT brain. Atrophy. Small-vessel disease. Opacification of left maxillary sinus and bulging of medial wall of left maxillary antrum appear stable.  Electronically Signed   By: Ernie Avena M.D.   On: 12/18/2021 19:10   DG Chest 2 View  Result Date: 12/18/2021 CLINICAL DATA:  Cough EXAM: CHEST - 2 VIEW COMPARISON:  07/23/2014 FINDINGS: Cardiac size is within normal limits. There are no signs of alveolar pulmonary edema. There is prominence of right hilum. Increased markings are seen in medial right lower lung field. Rest of the lung fields are clear. Left hemidiaphragm is slightly elevated. There is no pleural effusion or pneumothorax. IMPRESSION: Increased markings are seen in medial right lower lung fields suggesting atelectasis/pneumonia. Right hilum is more prominent on the left which may be due to adjacent infiltrate or enlarged lymph nodes. Short-term follow-up chest radiographs along with CT if warranted should be considered to rule out any underlying neoplastic process. Electronically Signed   By: Ernie Avena M.D.   On: 12/18/2021 13:24        Scheduled Meds:  amLODipine  5 mg Oral Daily   enoxaparin (LOVENOX) injection  40 mg Subcutaneous Q24H   feeding supplement  237 mL Oral BID BM   furosemide  20 mg Oral Daily   mirtazapine  7.5 mg Oral QHS   multivitamin with minerals  1 tablet Oral Daily   potassium chloride  40 mEq Oral Daily   Continuous Infusions:  ampicillin-sulbactam (UNASYN) IV 3 g (12/20/21 0829)     LOS: 2 days    Time spent: 39 min   Alwyn Ren, MD  12/20/2021, 1:06 PM

## 2021-12-21 ENCOUNTER — Inpatient Hospital Stay (HOSPITAL_COMMUNITY): Payer: Medicare Other

## 2021-12-21 DIAGNOSIS — J189 Pneumonia, unspecified organism: Secondary | ICD-10-CM | POA: Diagnosis not present

## 2021-12-21 LAB — COMPREHENSIVE METABOLIC PANEL
ALT: 15 U/L (ref 0–44)
AST: 18 U/L (ref 15–41)
Albumin: 3 g/dL — ABNORMAL LOW (ref 3.5–5.0)
Alkaline Phosphatase: 57 U/L (ref 38–126)
Anion gap: 7 (ref 5–15)
BUN: 19 mg/dL (ref 8–23)
CO2: 28 mmol/L (ref 22–32)
Calcium: 9.1 mg/dL (ref 8.9–10.3)
Chloride: 103 mmol/L (ref 98–111)
Creatinine, Ser: 1.46 mg/dL — ABNORMAL HIGH (ref 0.61–1.24)
GFR, Estimated: 50 mL/min — ABNORMAL LOW (ref >60)
Glucose, Bld: 91 mg/dL (ref 70–99)
Potassium: 4.1 mmol/L (ref 3.5–5.1)
Sodium: 138 mmol/L (ref 135–145)
Total Bilirubin: 0.5 mg/dL (ref 0.3–1.2)
Total Protein: 6.4 g/dL — ABNORMAL LOW (ref 6.5–8.1)

## 2021-12-21 LAB — CBC
HCT: 38.7 % — ABNORMAL LOW (ref 39.0–52.0)
Hemoglobin: 12.9 g/dL — ABNORMAL LOW (ref 13.0–17.0)
MCH: 26.5 pg (ref 26.0–34.0)
MCHC: 33.3 g/dL (ref 30.0–36.0)
MCV: 79.5 fL — ABNORMAL LOW (ref 80.0–100.0)
Platelets: 254 10*3/uL (ref 150–400)
RBC: 4.87 MIL/uL (ref 4.22–5.81)
RDW: 15.6 % — ABNORMAL HIGH (ref 11.5–15.5)
WBC: 9 10*3/uL (ref 4.0–10.5)
nRBC: 0 % (ref 0.0–0.2)

## 2021-12-21 LAB — GLUCOSE, CAPILLARY: Glucose-Capillary: 134 mg/dL — ABNORMAL HIGH (ref 70–99)

## 2021-12-21 NOTE — Progress Notes (Signed)
Patients sister called out to check on her brother.  He was minimally responsive sitting in the chair.  BP has gone down in the 90's over 60s but all other vitals remained stable including CBG.  Rapid was called to evaluate for stroke.  Patient did have decline in neuro status but was recovering as we evaluated.  Did have a loose BP during evaluation and was prior continent.  Will continue to monitor and have notified the MD.

## 2021-12-21 NOTE — [place] FL2 (Signed)
[place] MEDICAID FL2 LEVEL OF CARE SCREENING TOOL     IDENTIFICATION  Patient Name: Kenneth Benitez Birthdate: 05-Aug-1945 Sex: male Admission Date (Current Location): 12/18/2021  [facility] and [place] Number:  Producer, television/film/video and Address:  [facility], [address] [place], [place] [address]      Provider Number: [id number]  Attending Physician Name and Address:  Alwyn Ren, MD  Relative Name and Phone Number:       Current Level of Care: Hospital Recommended Level of Care: Skilled Nursing Facility Prior Approval Number:    Date Approved/Denied:   PASRR Number: [id number]  Discharge Plan: SNF    Current Diagnoses: Patient Active Problem List   Diagnosis Date Noted   Current moderate episode of major depressive disorder without prior episode (HCC)    CAP (community acquired pneumonia) 12/18/2021    Orientation RESPIRATION BLADDER Height & Weight     Self, Place  Normal Continent, External catheter Weight: 145 lb 15.1 oz (66.2 kg) Height:  5\' 5"  (165.1 cm)  BEHAVIORAL SYMPTOMS/MOOD NEUROLOGICAL BOWEL NUTRITION STATUS      Continent Diet (See dc summary)  AMBULATORY STATUS COMMUNICATION OF NEEDS Skin   Limited Assist Verbally Normal                       Personal Care Assistance Level of Assistance  Bathing, Feeding, Dressing Bathing Assistance: Limited assistance Feeding assistance: Limited assistance Dressing Assistance: Limited assistance     Functional Limitations Info  Sight, Hearing Sight Info: Impaired Hearing Info: Impaired      SPECIAL CARE FACTORS FREQUENCY  PT (By licensed PT), OT (By licensed OT)     PT Frequency: 5x/week OT Frequency: 5x/week            Contractures Contractures Info: Not present    Additional Factors Info  Code Status, Allergies, Psychotropic Code Status Info: Full Allergies Info: NKA Psychotropic Info: Remeron         Current Medications (12/21/2021):  This  is the current hospital active medication list Current Facility-Administered Medications  Medication Dose Route Frequency Provider Last Rate Last Admin   acetaminophen (TYLENOL) tablet 650 mg  650 mg Oral Q6H PRN 02/20/2022, Carole N, DO       amLODipine (NORVASC) tablet 5 mg  5 mg Oral Daily Hall, Carole N, DO   5 mg at 12/20/21 1000   Ampicillin-Sulbactam (UNASYN) 3 g in sodium chloride 0.9 % 100 mL IVPB  3 g Intravenous Q6H Curatolo, Adam, DO 200 mL/hr at 12/21/21 0823 3 g at 12/21/21 0823   enoxaparin (LOVENOX) injection 40 mg  40 mg Subcutaneous Q24H 02/20/22 N, DO   40 mg at 12/20/21 2140   feeding supplement (ENSURE ENLIVE / ENSURE PLUS) liquid 237 mL  237 mL Oral BID BM 2141, MD   237 mL at 12/20/21 1400   furosemide (LASIX) tablet 20 mg  20 mg Oral Daily 02/19/22, MD   20 mg at 12/20/21 1006   hydrALAZINE (APRESOLINE) injection 5 mg  5 mg Intravenous Q6H PRN 02/19/22 N, DO       mirtazapine (REMERON SOL-TAB) disintegrating tablet 7.5 mg  7.5 mg Oral QHS Dow Adolph, MD   7.5 mg at 12/20/21 2141   multivitamin with minerals tablet 1 tablet  1 tablet Oral Daily 2142, MD   1 tablet at 12/20/21 1000   ondansetron (ZOFRAN) injection 4 mg  4 mg Intravenous  Q6H PRN Dow Adolph N, DO       polyethylene glycol (MIRALAX / GLYCOLAX) packet 17 g  17 g Oral Daily PRN Dow Adolph N, DO       potassium chloride (KLOR-CON) packet 40 mEq  40 mEq Oral Daily Vassie Loll, MD   40 mEq at 12/20/21 1000     Discharge Medications: Please see discharge summary for a list of discharge medications.  Relevant Imaging Results:  Relevant Lab Results:   Additional Information SSN: [id number].  Mearl Latin, LCSW

## 2021-12-21 NOTE — Progress Notes (Signed)
PROGRESS NOTE    Kenneth Benitez  SWN:[id number] DOB: [birth year] DOA: 12/18/2021 PCP: Quitman Livings, MD  Brief Narrative:  As per H&P written by Dr. Margo Aye on 12/18/2021 Kenneth Benitez is a 76 y.o. male with medical history significant for hypertension, who presented from home where he lives with his sister, due to gradual decline in his overall health for the past 4 weeks.  Associated with generalized fatigue, a decline in his memory, and a sedentary lifestyle.  Endorses developing a productive cough about 4 days ago.  No subjective fevers or chills.  Reports new bilateral lower extremity edema for the past 3 weeks.  No chest pain.  No GI symptoms no focal motor deficits.  His mood has been depressed, recently separated from his wife, multiple deaths in the family, lost his brother in 2022 and his sister to bone cancer in 2021.     He was initially seen at the [facility] earlier today where he had a chest x-ray done.  Due to slow updates from the treatment team at the [facility] hospital, per the patient's sister, he was brought in to [facility], ED for further evaluation.     Work-up in the ED revealed failure to thrive, mild cognitive deficit, and concern for aspiration pneumonia.  Also possible undiagnosed depression.  Tearful in the room.  Agrees to see psych inpatient.  The patient was started on empiric IV antibiotics.  First set of high-sensitivity troponin and BNP were within normal limits.  CT angio was negative for pulmonary embolism.  TRH, hospitalist service, was asked to admit. Assessment & Plan:   Principal Problem:   CAP (community acquired pneumonia) Active Problems:   Current moderate episode of major depressive disorder without prior episode (HCC)  CAP (community acquired pneumonia)-concern for aspiration pneumonia.  Patient now on room air.  Continue bronchodilators encouraged to use incentive spirometer and flutter valves.finish course of antibiotics on Unasyn since 12/18/2021.    Current moderate episode of major depressive disorder without prior episode [facility]) - Patient seen by psychiatry service -Recommending outpatient follow-up with psychiatry. Continue low-dose Remeron.   Hypertension blood sugar improved 138/84.  Continue Norvasc 5 mg daily.  Continue as needed hydralazine.    Hypokalemia resolved.  Mag 1.8.Marland Kitchen  Follow-up labs pending today.   Bilateral lower extremity edema/grade 1 diastolic dysfunction -TSH within normal limits, albumin 3.1 -2D echo with EF 55 to 60%, grade 1 diastolic dysfunction  Low-dose Lasix added by the prior provider.  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    Nutrition Problem: Inadequate oral intake Etiology: lethargy/confusion, acute illness     Signs/Symptoms: per patient/family report    Interventions: Ensure Enlive (each supplement provides 350kcal and 20 grams of protein), MVI  Estimated body mass index is 24.29 kg/m as calculated from the following:   Height as of this encounter: 5\' 5"  (1.651 m).   Weight as of this encounter: 66.2 kg.  DVT prophylaxis: Lovenox  code Status: Full code Family Communication: None at bedside  disposition Plan:  Status is: Inpatient Remains inpatient appropriate because: Await SNF   Consultants:  None  Procedures: None Antimicrobials: Unasyn Subjective:  Patient is resting  in bed no events overnight  Objective: Vitals:   12/20/21 2310 12/21/21 0311 12/21/21 0421 12/21/21 0827  BP: (!) 143/81 131/85  138/84  Pulse: 79 77  77  Resp: 19   17  Temp: 98 F (36.7 C) 98.3 F (36.8 C)  98.1 F (36.7 C)   TempSrc: Axillary Oral  Oral  SpO2: 98% 93%    Weight:   66.2 kg   Height:        Intake/Output Summary (Last 24 hours) at 12/21/2021 1303 Last data filed at 12/21/2021 1100 Gross per 24 hour  Intake 539.87 ml  Output 1400 ml  Net -860.13 ml    Filed Weights   12/19/21 0227 12/20/21 0409 12/21/21 0421  Weight: 69 kg 66.7 kg 66.2 kg    Examination:  General exam: Appears in no acute distress Respiratory system: Few rhonchi to auscultation. Respiratory effort normal. Cardiovascular system: S1 & S2 heard, RRR. No JVD, murmurs, rubs, gallops or clicks. No pedal edema. Gastrointestinal system: Abdomen is nondistended, soft and nontender. No organomegaly or masses felt. Normal bowel sounds heard. Central nervous system: Alert and oriented. No focal neurological deficits. Extremities: 1+ edema bilaterally. Skin: No rashes, lesions or ulcers Psychiatry: Judgement and insight appear normal. Mood & affect appropriate.     Data Reviewed: I have personally reviewed following labs and imaging studies  CBC: Recent Labs  Lab 12/18/21 1251 12/19/21 1520  WBC 12.9* 10.4  NEUTROABS 9.2* 7.1  HGB 13.7 12.1*  HCT 43.2 36.0*  MCV 82.6 79.5*  PLT 246 [id number]    Basic Metabolic Panel: Recent Labs  Lab 12/18/21 1251 12/19/21 1520 12/20/21 0226  NA 143 141 140  K 3.0* 3.3* 4.7  CL 105 103 102  CO2 27 28 27   GLUCOSE 79 103* 81  BUN 12 10 19   CREATININE 1.42* 1.36* 1.41*  CALCIUM 9.9 9.0 9.1  MG 2.0 1.8  --   PHOS  --  2.8  --     GFR: Estimated Creatinine Clearance: 39.4 mL/min (A) (by C-G formula based on SCr of 1.41 mg/dL (H)). Liver Function Tests: Recent Labs  Lab 12/18/21 1251 12/19/21 1520  AST 24 22  ALT 23 18  ALKPHOS 78 55  BILITOT 0.4 0.7  PROT 7.9 6.2*  ALBUMIN 4.1 3.1*    No results for input(s): "LIPASE", "AMYLASE" in the last 168 hours. Recent Labs  Lab 12/18/21 1803  AMMONIA 19    Coagulation Profile: No results for input(s): "INR", "PROTIME"  in the last 168 hours. Cardiac Enzymes: No results for input(s): "CKTOTAL", "CKMB", "CKMBINDEX", "TROPONINI" in the last 168 hours. BNP (last 3 results) No results for input(s): "PROBNP" in the last 8760 hours. HbA1C: No results for input(s): "HGBA1C" in the last 72 hours. CBG: Recent Labs  Lab 12/18/21 1701  GLUCAP 78    Lipid Profile: No results for input(s): "CHOL", "HDL", "LDLCALC", "TRIG", "CHOLHDL", "LDLDIRECT" in the last 72 hours. Thyroid Function Tests: Recent Labs    12/19/21 1520  TSH 1.703    Anemia Panel: No results for input(s): "VITAMINB12", "FOLATE", "FERRITIN", "TIBC", "IRON", "RETICCTPCT" in the last 72 hours. Sepsis Labs: Recent Labs  Lab 12/18/21 1803 12/19/21 1520  PROCALCITON  --  <0.10  LATICACIDVEN 1.2  --      Recent Results (from the past 240 hour(s))  Urine Culture     Status: None   Collection Time: 12/18/21 12:51 PM   Specimen: Urine, Clean Catch  Result Value Ref Range Status   Specimen  Description URINE, CLEAN CATCH  Final   Special Requests NONE  Final   Culture   Final    NO GROWTH Performed at [facility], [place] [address], [place], [place] [address]    Report Status 12/19/2021 FINAL  Final  Resp Panel by RT-PCR (Flu A&B, Covid) Anterior Nasal Swab     Status: None   Collection Time: 12/18/21 12:53 PM   Specimen: Anterior Nasal Swab  Result Value Ref Range Status   SARS Coronavirus 2 by RT PCR NEGATIVE NEGATIVE Final    Comment: (NOTE) SARS-CoV-2 target nucleic acids are NOT DETECTED.  The SARS-CoV-2 RNA is generally detectable in upper respiratory specimens during the acute phase of infection. The lowest concentration of SARS-CoV-2 viral copies this assay can detect is 138 copies/mL. A negative result does not preclude SARS-Cov-2 infection and should not be used as the sole basis for treatment or other patient management decisions. A negative result may occur with  improper specimen collection/handling, submission of  specimen other than nasopharyngeal swab, presence of viral mutation(s) within the areas targeted by this assay, and inadequate number of viral copies(<138 copies/mL). A negative result must be combined with clinical observations, patient history, and epidemiological information. The expected result is Negative.  Fact Sheet for Patients:  EntrepreneurPulse.com.au  Fact Sheet for Healthcare Providers:  IncredibleEmployment.be  This test is no t yet approved or cleared by the [place] FDA and  has been authorized for detection and/or diagnosis of SARS-CoV-2 by FDA under an Emergency Use Authorization (EUA). This EUA will remain  in effect (meaning this test can be used) for the duration of the COVID-19 declaration under Section 564(b)(1) of the Act, 21 U.S.C.section 360bbb-3(b)(1), unless the authorization is terminated  or revoked sooner.       Influenza A by PCR NEGATIVE NEGATIVE Final   Influenza B by PCR NEGATIVE NEGATIVE Final    Comment: (NOTE) The Xpert Xpress SARS-CoV-2/FLU/RSV plus assay is intended as an aid in the diagnosis of influenza from Nasopharyngeal swab specimens and should not be used as a sole basis for treatment. Nasal washings and aspirates are unacceptable for Xpert Xpress SARS-CoV-2/FLU/RSV testing.  Fact Sheet for Patients: EntrepreneurPulse.com.au  Fact Sheet for Healthcare Providers: IncredibleEmployment.be  This test is not yet approved or cleared by the [place] FDA and has been authorized for detection and/or diagnosis of SARS-CoV-2 by FDA under an Emergency Use Authorization (EUA). This EUA will remain in effect (meaning this test can be used) for the duration of the COVID-19 declaration under Section 564(b)(1) of the Act, 21 U.S.C. section 360bbb-3(b)(1), unless the authorization is terminated or revoked.  Performed at [facility], [place] [address]  [place], [place] [address]          Radiology Studies: DG Swallowing Func-Speech Pathology  Result Date: 12/21/2021 Table formatting from the original result was not included. Objective Swallowing Evaluation: Type of Study: MBS-Modified Barium Swallow Study  Patient Details Name: Kenneth Benitez MRN: [id number] Date of Birth: 07-05-1945 Today's Date: 12/21/2021 Time: SLP Start Time (ACUTE ONLY): 0854 -SLP Stop Time (ACUTE ONLY): 0912 SLP Time Calculation (min) (ACUTE ONLY): 18 min Past Medical History: No past medical history on file. Past Surgical History: No past surgical history on file. HPI: Patient is a 76 y.o. male with PMH of HTN who presented to the hospital from home on 12/18/2021 due to 4 day h/o productive cough, new bilateral lower extremity edema for past 3 weeks. Over the past 4  weeks he has had a gradual decline in his memory and overall health, and has become more sedantary. Of note, he has had several life changing events such as death of his brother in Aug 02, 2020, death of his sister from bone cancer in 08-03-19 and recent separation from his wife. He currently lives with his daughter. Work-up in ED revealed FTT, mild cognitive deficit and concern for aspiration PNA. In addition, possible undiagnosed depression. CT angio chest pulmonary PE was negative for PE but "Patulous esophagus containing air-fluid level up to the upper-mid esophagus; Pulmonary findings consistent with small airway infection/inflammation. Right lower lobe opacities consistent with pneumonia/pneumonitis. Given the patulous esophagus containing layering fluid this may be related to aspiration."  Subjective: pt denies trouble swallowing but does say he;s been coughing with meals over the last two weeks  Recommendations for follow up therapy are one component of a multi-disciplinary discharge planning process, led by the attending physician.  Recommendations may be updated based on patient status, additional functional criteria and  insurance authorization. Assessment / Plan / Recommendation   12/21/2021   9:00 AM Clinical Impressions Clinical Impression Pt has a moderate oropharyngeal dysphagia with reduced posterior propulsion of boluses and mastication of solids. He also has a delay in swallow initiation, at times needing cues to swallow. He has a tendancy to take a second bite or sip while there is a bolus still sitting in his pharynx, and when boluses sit in and fill up his pyriform sinuses, they then spill into the airway before he swallow. Aspiration did not occur initially, but started to occur with much more frequency as study continued, still related to impaired timing but also question impact of fatigue on lingual propulsion and/or attention to task. Aspiration occurred with thin, nectar thick, and honey thick liquids when he could not initiate a swallow in a timely enough manner. Although he did not always cough with smaller amounts of aspiration, coughing during the study was always indicative of aspiration. Pt denies any subjective difficulties but when asked if coughing like that has been occurring during meals, he says yes, but only for the last two weeks. Question if this could be suggestive of an acute decline, for which we will trial dysphagia therapy. In the meantime, will adjust diet to Dys 2 solids and pudding thick liquids (nothing thinner than a puree). SLP Visit Diagnosis Dysphagia, oropharyngeal phase (R13.12) Impact on safety and function Moderate aspiration risk;Severe aspiration risk;Risk for inadequate nutrition/hydration     12/21/2021   9:00 AM Treatment Recommendations Treatment Recommendations Therapy as outlined in treatment plan below     12/21/2021   9:00 AM Prognosis Prognosis for Safe Diet Advancement Good   12/21/2021   9:00 AM Diet Recommendations SLP Diet Recommendations Dysphagia 2 (Fine chop) solids;Pudding thick liquid Liquid Administration via Spoon Medication Administration Crushed with puree  Compensations Slow rate;Small sips/bites;Other (Comment) Postural Changes Seated upright at 90 degrees;Remain semi-upright after after feeds/meals (Comment)     12/21/2021   9:00 AM Other Recommendations Recommended Consults Consider esophageal assessment Oral Care Recommendations Oral care BID Follow Up Recommendations Skilled nursing-short term rehab (<3 hours/day) Assistance recommended at discharge Intermittent Supervision/Assistance Functional Status Assessment Patient has had a recent decline in their functional status and demonstrates the ability to make significant improvements in function in a reasonable and predictable amount of time.   12/21/2021   9:00 AM Frequency and Duration  Speech Therapy Frequency (ACUTE ONLY) min 2x/week Treatment Duration 2 weeks     12/21/2021  9:00 AM Oral Phase Oral Phase Impaired Oral - Honey Teaspoon Delayed oral transit Oral - Honey Cup Delayed oral transit Oral - Nectar Teaspoon Delayed oral transit Oral - Nectar Cup Delayed oral transit Oral - Thin Cup Decreased bolus cohesion;Premature spillage Oral - Puree Delayed oral transit Oral - Regular Impaired mastication;Delayed oral transit    12/21/2021   9:00 AM Pharyngeal Phase Pharyngeal Phase Impaired Pharyngeal- Honey Teaspoon Delayed swallow initiation-pyriform sinuses;Penetration/Aspiration before swallow Pharyngeal Material enters airway, passes BELOW cords without attempt by patient to eject out (silent aspiration) Pharyngeal- Honey Cup Penetration/Aspiration before swallow;Delayed swallow initiation-pyriform sinuses Pharyngeal Material enters airway, passes BELOW cords without attempt by patient to eject out (silent aspiration);Material enters airway, passes BELOW cords and not ejected out despite cough attempt by patient Pharyngeal- Nectar Teaspoon Delayed swallow initiation-pyriform sinuses;Penetration/Aspiration before swallow Pharyngeal Material enters airway, CONTACTS cords and not ejected out Pharyngeal- Nectar  Cup Penetration/Aspiration before swallow;Delayed swallow initiation-pyriform sinuses Pharyngeal Material enters airway, passes BELOW cords and not ejected out despite cough attempt by patient Pharyngeal- Thin Cup Delayed swallow initiation-pyriform sinuses;Penetration/Aspiration before swallow Pharyngeal Material enters airway, passes BELOW cords and not ejected out despite cough attempt by patient Pharyngeal- Puree Delayed swallow initiation-vallecula;Penetration/Aspiration before swallow Pharyngeal Material enters airway, remains ABOVE vocal cords then ejected out Pharyngeal- Regular Delayed swallow initiation-vallecula    12/21/2021   9:00 AM Cervical Esophageal Phase  Cervical Esophageal Phase [facility] Mahala Menghini., M.A. CCC-SLP Acute Rehabilitation Services Office [phone] Secure chat preferred 12/21/2021, 9:42 AM                          Scheduled Meds:  amLODipine  5 mg Oral Daily   enoxaparin (LOVENOX) injection  40 mg Subcutaneous Q24H   feeding supplement  237 mL Oral BID BM   furosemide  20 mg Oral Daily   mirtazapine  7.5 mg Oral QHS   multivitamin with minerals  1 tablet Oral Daily   potassium chloride  40 mEq Oral Daily   Continuous Infusions:  ampicillin-sulbactam (UNASYN) IV 3 g (12/21/21 0823)     LOS: 3 days    Time spent: 30 minutes   Alwyn Ren, MD  12/21/2021, 1:03 PM

## 2021-12-21 NOTE — TOC Initial Note (Addendum)
Transition of Care [facility]) - Initial/Assessment Note    Patient Details  Name: Kenneth Benitez MRN: [id number] Date of Birth: 06/09/1945  Transition of Care [facility]) CM/SW Contact:    Benard Halsted, LCSW Phone Number: 12/21/2021, 2:24 PM  Clinical Narrative:                 2:24pm-CSW received consult for possible SNF placement at time of discharge. CSW attempted to speak with patient however he would not interact with CSW. CSW contacted his sister, Janice Norrie. She reported that patient's family is currently unable to care for patient at their home given patient's current physical needs and fall risk. She expressed understanding of PT recommendation and is agreeable to SNF placement at time of discharge and stated patient was in agreement yesterday when family was visiting. CSW discussed insurance authorization process and will provide Medicare SNF ratings list. CSW provided bed offers to her and she will consult with her sisters and call CSW back with choice. CSW initiated insurance authorization pending a facility choice, Ref# [id number].  4pm-CSW received call from patient's sister. They have selected [facility]. CSW updated insurance. Sister will complete paperwork there tomorrow at 2pm. She is requesting PTAR for transport.   5:47pm-Insurance approval received for [facility], effective 12/22/2021-12/24/2021.  Skilled Nursing Rehab Facilities-   [email or web address]   Ratings out of 5 stars (the highest)   Name Address  Phone # [place] Inspection Overall  [facility] [address] [place] 5 5 2 4   [facility]  [address] [place], [place] [phone] 4 2 5 5   [facility] [place], [place] 1 1 1 1   [place], [place] 2 2 4 4   [facility] [address] [place] 1 1 2 1   [place] [facility], [place] [phone] 3 2 4 4   [facility] [address] [place] 5 1 3 3   [facility] [address] [place] 5 2 3 4   [address] ([place]) [place], [place] [phone] 4 1 2 1   [facility] [phone] [address], [place] [phone] 4 1 1 1   [facility] [address] [facility] [phone] 3 1 2 1   [facility] [place] [place], [place] [phone] 4 1 1 1   [place]ustin Flock [address] [id number] 4 2 4 4           [place] [address] [place] 4 1 3 2   [facility] [place] [address] [place] 3 1 5 4   [place], [place] [phone] 1 1 1 1   [facility] [address] [employer] [phone] 2 2 3 3           [address] (no [facility]) [place] [address], [place] [phone] 5 5 5 5   [facility] (No Humana) [address], [place] 4 1 4 3   [place] (No UHC) [place], [place] 5 5 5 5   [facility] [address] [employer] [phone] 2 3 5 5   [place] [address], [place] 1 1 2 1   [place] [address], [place] [phone] 3 1 1 1   [place], [place] 5 2 4 5   [place]  [facility]  [address] [place] [phone] 2 2 1 1   [facility] [address], [address] [place] 3 2 1 1   [facility] [address] [place], [place] 2 2 2 2           [facility] [address], [place] [phone] 1 1 1 1   [place] [address]  [phone] 2 4 2 2   [facility] [address], [address] [phone] 4 2 3 3   [facility] [place] [address], [place] [phone] 2 1 1 1   [facility] (No Humana) [address]  [place], [phone] 2 2 3 3   [facility] [address] [phone] 2 1 1 1           [facility] [address] [place], [facility] 5 4 5 5   [facility])  [address] [facility] 2 1 2 1   [facility]) [address], [place] [phone] 3 1 4 3   [facility] [place] [address] [place] 3 3 4 4   [address] [place], [place] 2 3 1 1   [phone] Re[facility]) [address] [place] [phone] 2 1 4 3      Expected Discharge Plan: Skilled Nursing Facility Barriers to Discharge: Continued Medical Work up, Insurance Authorization   Patient Goals and CMS Choice Patient states their goals for this hospitalization and ongoing recovery are:: Patients sister, [phone] wants pt placed in a ALF. CMS Medicare.gov Compare Post Acute Care list provided to:: Other (Comment Required) (Patients Sister, [place]) Choice offered to / list presented to : Sibling, Patient  Expected Discharge Plan and Services Expected Discharge Plan: Skilled Nursing Facility In-house Referral: Clinical Social Work   Post Acute Care Choice: Skilled Nursing Facility Living arrangements for the past 2 months: Single Family Home                                      Prior Living Arrangements/Services Living arrangements for the past 2 months: Single Family Home Lives with:: Siblings Patient language and need for interpreter reviewed:: Yes Do you feel safe going back to the place where you live?: Yes      Need for Family Participation in Patient Care: Yes (Comment) Care giver support system in place?: Yes (comment)   Criminal Activity/Legal Involvement Pertinent to Current Situation/Hospitalization: No - Comment as needed  Activities of Daily Living      Permission Sought/Granted Permission sought to share information with : Facility  [place], Family Supports Permission granted to share information with : Yes, Verbal Permission Granted  Share Information with NAME: [phone]  Permission granted to share info w AGENCY: SNFs  Permission granted to share info w Relationship: Sister  Permission granted to share info w Contact Information: [phone]  Emotional Assessment Appearance:: Appears stated age Attitude/Demeanor/Rapport: Unable to Assess Affect (typically observed): Unable to Assess Orientation: : Oriented to Self, Oriented to Place, Oriented to  Time, Oriented to Situation Alcohol / Substance Use: Not Applicable Psych Involvement: No (comment)  Admission diagnosis:  CAP (community acquired pneumonia) [J18.9] Altered mental status, unspecified altered mental status type [R41.82] Patient Active Problem List   Diagnosis Date Noted   Current moderate episode of major depressive disorder without prior episode (HCC)    CAP (community acquired pneumonia) 12/18/2021   PCP:  Quitman Livings, MD Pharmacy:   [id number] - [place], [place] - [address] AT [facility] [address] [place] [address] Phone: [phone] Fax: [phone]     Social Determinants of Health (SDOH) Interventions    Readmission Risk Interventions     No data to display

## 2021-12-21 NOTE — Progress Notes (Signed)
Modified Barium Swallow Progress Note  Patient Details  Name: Kenneth Benitez MRN: [id number] Date of Birth: 04/24/1945  Today's Date: 12/21/2021  Modified Barium Swallow completed.  Full report located under Chart Review in the Imaging Section.  Brief recommendations include the following:  Clinical Impression  Pt has a moderate oropharyngeal dysphagia with reduced posterior propulsion of boluses and mastication of solids. He also has a delay in swallow initiation, at times needing cues to swallow. He has a tendancy to take a second bite or sip while there is a bolus still sitting in his pharynx, and when boluses sit in and fill up his pyriform sinuses, they then spill into the airway before he swallow. Aspiration did not occur initially, but started to occur with much more frequency as study continued, still related to impaired timing but also question impact of fatigue on lingual propulsion and/or attention to task. Aspiration occurred with thin, nectar thick, and honey thick liquids when he could not initiate a swallow in a timely enough manner. Although he did not always cough with smaller amounts of aspiration, coughing during the study was always indicative of aspiration. Pt denies any subjective difficulties but when asked if coughing like that has been occurring during meals, he says yes, but only for the last two weeks. Question if this could be suggestive of an acute decline, for which we will trial dysphagia therapy. In the meantime, will adjust diet to Dys 2 solids and pudding thick liquids (nothing thinner than a puree).   Swallow Evaluation Recommendations   Recommended Consults: Consider esophageal assessment   SLP Diet Recommendations: Dysphagia 2 (Fine chop) solids;Pudding thick liquid   Liquid Administration via: Spoon   Medication Administration: Crushed with puree   Supervision: Patient able to self feed;Full supervision/cueing for compensatory strategies    Compensations: Slow rate;Small sips/bites;Other (Comment) (cue to swallow as needed before pt takes another bite or sip)   Postural Changes: Seated upright at 90 degrees;Remain semi-upright after after feeds/meals (Comment)   Oral Care Recommendations: Oral care BID        Mahala Menghini., M.A. CCC-SLP Acute Rehabilitation Services Office [phone]  Secure chat preferred  12/21/2021,9:41 AM

## 2021-12-21 NOTE — Significant Event (Signed)
Rapid Response Event Note   Reason for Call :  Per RN patient has been up to the bedside commode and then sat in chair and ate dinner.  He suddenly became minimally responsive.   Initial Focused Assessment:  Patient being assisted back to bed on my arrival.  Once back in bed he improved rapidly.  BP 106/61  HR 73 RR 30 O2 sat 99% on RA Coughing frequently, non productive Lung sounds coarse/decreased bases Heart tones regular  NIHSS 3 Did not answer age and month correctly. He is mildly dysarthric, per sister his speech had been "garbled" recently   Interventions:  Assisted back to bed, positioned flat  Plan of Care:  Notify attending night coverage of event.    Event Summary:   MD Notified: RN notified [facility] night coverage Call Time: 1900 Arrival Time: 1903 End Time: 1930  Marcellina Millin, RN

## 2021-12-21 NOTE — Care Management Important Message (Signed)
Important Message  Patient Details  Name: Kenneth Benitez MRN: [id number] Date of Birth: 02/15/1946   Medicare Important Message Given:  Yes     Noor Witte [name] 12/21/2021, 3:49 PM

## 2021-12-22 ENCOUNTER — Inpatient Hospital Stay (HOSPITAL_COMMUNITY): Payer: Medicare Other

## 2021-12-22 DIAGNOSIS — J189 Pneumonia, unspecified organism: Secondary | ICD-10-CM | POA: Diagnosis not present

## 2021-12-22 MED ORDER — AMOXICILLIN-POT CLAVULANATE 500-125 MG PO TABS: 1.0000 | ORAL_TABLET | Freq: Three times a day (TID) | ORAL | 0 refills | Status: AC

## 2021-12-22 MED ORDER — AMLODIPINE BESYLATE 5 MG PO TABS: 5.0000 mg | ORAL_TABLET | Freq: Every day | ORAL | 2 refills | Status: AC

## 2021-12-22 MED ORDER — MIRTAZAPINE 15 MG PO TBDP: 7.5000 mg | ORAL_TABLET | Freq: Every day | ORAL | 2 refills | Status: AC

## 2021-12-22 MED ORDER — POLYETHYLENE GLYCOL 3350 17 G PO PACK: 17.0000 g | PACK | Freq: Every day | ORAL | 0 refills | Status: AC | PRN

## 2021-12-22 NOTE — Progress Notes (Signed)
PT Cancellation Note  Patient Details Name: Kenneth Benitez MRN: [id number] DOB: 1946/03/18   Cancelled Treatment:    Reason Eval/Treat Not Completed: Medical issues which prohibited therapy (going for stat head CT this am. will return as able.)   Bevelyn Buckles 12/22/2021, 9:50 AM Karry Causer M,PT Acute Rehab Services [phone]

## 2021-12-22 NOTE — TOC Transition Note (Signed)
Transition of Care [facility]) - CM/SW Discharge Note   Patient Details  Name: Kenneth Benitez MRN: [id number] Date of Birth: 1945/08/30  [facility] of [facility]) CM/SW Contact:  Kenneth Latin, LCSW Phone Number: 12/22/2021, 12:15 PM   Clinical Narrative:    Patient will DC to: [facility] Anticipated DC date: 12/22/21 Family notified: Kenneth Benitez, sister Transport by: Kenneth Benitez    Per MD patient ready for DC to [facility]. RN to call report prior to discharge [phone] room 203). RN, patient, patient's family, and facility notified of DC. Discharge Summary and FL2 sent to facility. DC packet on chart. Ambulance transport requested for patient.   CSW will sign off for now as social work intervention is no longer needed. Please consult [place] again if new needs arise.     Final next level of care: Skilled Nursing Facility Barriers to Discharge: Barriers Resolved   Patient Goals and CMS Choice Patient states their goals for this hospitalization and ongoing recovery are:: Patients sister, Kenneth Benitez wants pt placed in a ALF. CMS Medicare.gov Compare Post Acute Care list provided to:: Other (Comment Required) (Patients Sister, Kenneth Benitez) Choice offered to / list presented to : Sibling, Patient  Discharge Placement   Existing PASRR number confirmed : 12/22/21          [facility] Patient to be transferred to facility by: PTAR Name of family member notified: Sister Patient and family notified of of transfer: 12/22/21  Discharge Plan and Services In-house Referral: Clinical Social Work   Post Acute Care Choice: Skilled Nursing Facility                               Social Determinants of Health (SDOH) Interventions     Readmission Risk Interventions     No data to display

## 2021-12-22 NOTE — Discharge Summary (Addendum)
Physician Discharge Summary  Kenneth Benitez UJW:[id number] DOB: [birth year] DOA: 12/18/2021  PCP: Quitman Livings, MD  Admit date: 12/18/2021 Discharge date: 12/22/2021  Admitted From: Home Disposition: Nursing home   Recommendations for Outpatient Follow-up:  Follow up with PCP in 1-2 weeks Please obtain BMP/CBC in one week Please follow up with psychiatrist for depression Home Health none Equipment/Devices: None   Discharge Condition: stable  CODE STATUS: Full code Diet recommendation: Cardiac Brief/Interim Summary: Kenneth Benitez is a 76 y.o. male with medical history significant for hypertension, who presented from home where he lives with his sister, due to gradual decline in his overall health for the past 4 weeks.  Associated with generalized fatigue, a decline in his memory, and a sedentary lifestyle.  Endorses developing a productive cough about 4 days ago.  No subjective fevers or chills.  Reports new bilateral lower extremity edema for the past 3 weeks.  No chest pain.  No GI symptoms no focal motor deficits.  His mood has been depressed, recently separated from his wife, multiple deaths in the family, lost his brother in 2022 and his sister to bone cancer in 2021.     He was initially seen at the [facility] earlier today where he had a chest x-ray done.  Due to slow updates from the treatment team at the [facility] hospital, per the patient's sister, he was brought in to [facility], ED for further evaluation.     Work-up in the ED revealed failure to thrive, mild cognitive deficit, and concern for aspiration pneumonia.  Also possible undiagnosed depression.  Tearful in the room.  Agrees to see psych inpatient.  The patient was started on empiric IV antibiotics.  First set of high-sensitivity troponin and BNP were within normal limits.  CT angio was negative for pulmonary embolism.  TRH, hospitalist service, was asked to admit.  Discharge Diagnoses:  Principal Problem:   CAP (community  acquired pneumonia) Active Problems:   Current moderate episode of major depressive disorder without prior episode (HCC)    CAP (community acquired pneumonia)-concern for aspiration pneumonia.  Patient now on room air.  He was treated with Unasyn and bronchodilators.  Finish a course of antibiotics with Augmentin for another 2 days on discharge.   Current moderate episode of major depressive disorder without prior episode [facility]) - Patient seen by psychiatry service -Recommending outpatient follow-up with psychiatry. Continue low-dose Remeron.   Hypertension .  Continue Norvasc 5 mg daily.    Hypokalemia resolved.  Check BMP once a week at the nursing home.  Check magnesium once a week.  Check CBC once a week. Patient might get orthostatic if you changes position rapidly.  Give him 5 minutes to sit up and then to stand up and move slowly to avoid orthostatic hypotension. CT head done x2 this admission shows atrophy and chronic small vessel ischemic changes.  No acute findings noted.  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  Acute toxic encephalopathy secondary to pneumonia secondary to aspiration.  This is  resolved with treatment.   Nutrition Problem: Inadequate oral intake Etiology: lethargy/confusion, acute illness    Signs/Symptoms: per patient/family report     Interventions: Ensure Enlive (each supplement provides 350kcal and 20 grams of protein),  MVI  Estimated body mass index is 23.85 kg/m as calculated from the following:   Height as of this encounter: 5\' 5"  (1.651 m).   Weight as of this encounter: 65 kg.  Discharge Instructions  Discharge Instructions     Diet - low sodium heart healthy   Complete by: As directed    Diet - low sodium heart healthy   Complete by: As directed    Increase activity slowly   Complete by: As directed    Increase activity slowly   Complete by: As directed       Allergies as of 12/22/2021   No Known Allergies      Medication List     STOP taking these medications    methocarbamol 500 MG tablet Commonly known as: ROBAXIN   naproxen 250 MG tablet Commonly known as: Naprosyn   predniSONE 20 MG tablet Commonly known as: DELTASONE       TAKE these medications    amLODipine 5 MG tablet Commonly known as: NORVASC Take 1 tablet (5 mg total) by mouth daily. Start taking on: December 23, 2021   amoxicillin-clavulanate 500-125 MG tablet Commonly known as: Augmentin Take 1 tablet (500 mg total) by mouth 3 (three) times daily.   mirtazapine 15 MG disintegrating tablet Commonly known as: REMERON SOL-TAB Take 0.5 tablets (7.5 mg total) by mouth at bedtime.   polyethylene glycol 17 g packet Commonly known as: MIRALAX / GLYCOLAX Take 17 g by mouth daily as needed for mild constipation.        Contact information for after-discharge care     Destination     [facility] Preferred SNF .   Service: Skilled Nursing Contact information: [address] [place] [phone]                    No Known Allergies  Consultations: Psych   Procedures/Studies: CT HEAD WO CONTRAST ([phone])  Result Date: 12/22/2021 CLINICAL DATA:  Syncope/presyncope, cerebrovascular cause suspected EXAM: CT HEAD WITHOUT CONTRAST TECHNIQUE: Contiguous axial images were obtained from the base of the skull through the vertex without  intravenous contrast. RADIATION DOSE REDUCTION: This exam was performed according to the departmental dose-optimization program which includes automated exposure control, adjustment of the mA and/or kV according to patient size and/or use of iterative reconstruction technique. COMPARISON:  CT head 12/18/2021. FINDINGS: Brain: No evidence of acute infarction, hemorrhage, hydrocephalus, extra-axial collection or mass lesion/mass effect. Patchy and confluent white matter hypodensities, nonspecific but compatible with severe microvascular ischemic disease. Atrophy Vascular: No hyperdense vessel identified. Skull: No acute fracture. Sinuses/Orbits: Completely opacified left maxillary sinus, partially imaged. Other: No mastoid IMPRESSION: 1. No evidence of acute intracranial abnormality. 2. Severe chronic microvascular ischemic disease. 3. Completely opacified left maxillary sinus. Electronically Signed   By: 02/17/2022 M.D.   On: 12/22/2021 10:37   DG Chest 1 View  Result Date: 12/22/2021 CLINICAL DATA:  Cough, shortness of breath. EXAM: CHEST  1 VIEW COMPARISON:  December 18, 2021. FINDINGS: The heart size and mediastinal contours are within normal limits. Right lung is clear. Minimal left basilar subsegmental atelectasis or scarring is noted. The visualized skeletal structures are unremarkable. IMPRESSION: Minimal left basilar subsegmental atelectasis or scarring. Electronically Signed   By:  Marijo Conception M.D.   On: 12/22/2021 09:35   DG Swallowing Func-Speech Pathology  Result Date: 12/21/2021 Table formatting from the original result was not included. Objective Swallowing Evaluation: Type of Study: MBS-Modified Barium Swallow Study  Patient Details Name: Kenneth Benitez MRN: [id number] Date of Birth: [birth year] Today's Date: 12/21/2021 Time: SLP Start Time (ACUTE ONLY): 0854 -SLP Stop Time (ACUTE ONLY): 0912 SLP Time Calculation (min) (ACUTE ONLY): 18 min Past Medical History: No past medical history  on file. Past Surgical History: No past surgical history on file. HPI: Patient is a 76 y.o. male with PMH of HTN who presented to the hospital from home on 12/18/2021 due to 4 day h/o productive cough, new bilateral lower extremity edema for past 3 weeks. Over the past 4 weeks he has had a gradual decline in his memory and overall health, and has become more sedantary. Of note, he has had several life changing events such as death of his brother in August 09, 2020, death of his sister from bone cancer in 08/10/2019 and recent separation from his wife. He currently lives with his daughter. Work-up in ED revealed FTT, mild cognitive deficit and concern for aspiration PNA. In addition, possible undiagnosed depression. CT angio chest pulmonary PE was negative for PE but "Patulous esophagus containing air-fluid level up to the upper-mid esophagus; Pulmonary findings consistent with small airway infection/inflammation. Right lower lobe opacities consistent with pneumonia/pneumonitis. Given the patulous esophagus containing layering fluid this may be related to aspiration."  Subjective: pt denies trouble swallowing but does say he;s been coughing with meals over the last two weeks  Recommendations for follow up therapy are one component of a multi-disciplinary discharge planning process, led by the attending physician.  Recommendations may be updated based on patient status, additional functional criteria and insurance authorization. Assessment / Plan / Recommendation   12/21/2021   9:00 AM Clinical Impressions Clinical Impression Pt has a moderate oropharyngeal dysphagia with reduced posterior propulsion of boluses and mastication of solids. He also has a delay in swallow initiation, at times needing cues to swallow. He has a tendancy to take a second bite or sip while there is a bolus still sitting in his pharynx, and when boluses sit in and fill up his pyriform sinuses, they then spill into the airway before he swallow. Aspiration did not  occur initially, but started to occur with much more frequency as study continued, still related to impaired timing but also question impact of fatigue on lingual propulsion and/or attention to task. Aspiration occurred with thin, nectar thick, and honey thick liquids when he could not initiate a swallow in a timely enough manner. Although he did not always cough with smaller amounts of aspiration, coughing during the study was always indicative of aspiration. Pt denies any subjective difficulties but when asked if coughing like that has been occurring during meals, he says yes, but only for the last two weeks. Question if this could be suggestive of an acute decline, for which we will trial dysphagia therapy. In the meantime, will adjust diet to Dys 2 solids and pudding thick liquids (nothing thinner than a puree). SLP Visit Diagnosis Dysphagia, oropharyngeal phase (R13.12) Impact on safety and function Moderate aspiration risk;Severe aspiration risk;Risk for inadequate nutrition/hydration     12/21/2021   9:00 AM Treatment Recommendations Treatment Recommendations Therapy as outlined in treatment plan below     12/21/2021   9:00 AM Prognosis Prognosis for Safe Diet Advancement Good   12/21/2021   9:00 AM  Diet Recommendations SLP Diet Recommendations Dysphagia 2 (Fine chop) solids;Pudding thick liquid Liquid Administration via Spoon Medication Administration Crushed with puree Compensations Slow rate;Small sips/bites;Other (Comment) Postural Changes Seated upright at 90 degrees;Remain semi-upright after after feeds/meals (Comment)     12/21/2021   9:00 AM Other Recommendations Recommended Consults Consider esophageal assessment Oral Care Recommendations Oral care BID Follow Up Recommendations Skilled nursing-short term rehab (<3 hours/day) Assistance recommended at discharge Intermittent Supervision/Assistance Functional Status Assessment Patient has had a recent decline in their functional status and demonstrates the  ability to make significant improvements in function in a reasonable and predictable amount of time.   12/21/2021   9:00 AM Frequency and Duration  Speech Therapy Frequency (ACUTE ONLY) min 2x/week Treatment Duration 2 weeks     12/21/2021   9:00 AM Oral Phase Oral Phase Impaired Oral - Honey Teaspoon Delayed oral transit Oral - Honey Cup Delayed oral transit Oral - Nectar Teaspoon Delayed oral transit Oral - Nectar Cup Delayed oral transit Oral - Thin Cup Decreased bolus cohesion;Premature spillage Oral - Puree Delayed oral transit Oral - Regular Impaired mastication;Delayed oral transit    12/21/2021   9:00 AM Pharyngeal Phase Pharyngeal Phase Impaired Pharyngeal- Honey Teaspoon Delayed swallow initiation-pyriform sinuses;Penetration/Aspiration before swallow Pharyngeal Material enters airway, passes BELOW cords without attempt by patient to eject out (silent aspiration) Pharyngeal- Honey Cup Penetration/Aspiration before swallow;Delayed swallow initiation-pyriform sinuses Pharyngeal Material enters airway, passes BELOW cords without attempt by patient to eject out (silent aspiration);Material enters airway, passes BELOW cords and not ejected out despite cough attempt by patient Pharyngeal- Nectar Teaspoon Delayed swallow initiation-pyriform sinuses;Penetration/Aspiration before swallow Pharyngeal Material enters airway, CONTACTS cords and not ejected out Pharyngeal- Nectar Cup Penetration/Aspiration before swallow;Delayed swallow initiation-pyriform sinuses Pharyngeal Material enters airway, passes BELOW cords and not ejected out despite cough attempt by patient Pharyngeal- Thin Cup Delayed swallow initiation-pyriform sinuses;Penetration/Aspiration before swallow Pharyngeal Material enters airway, passes BELOW cords and not ejected out despite cough attempt by patient Pharyngeal- Puree Delayed swallow initiation-vallecula;Penetration/Aspiration before swallow Pharyngeal Material enters airway, remains ABOVE vocal  cords then ejected out Pharyngeal- Regular Delayed swallow initiation-vallecula    12/21/2021   9:00 AM Cervical Esophageal Phase  Cervical Esophageal Phase [facility] Osie Bond., M.A. [place] Office [phone] Secure chat preferred 12/21/2021, 9:42 AM                     ECHOCARDIOGRAM COMPLETE  Result Date: 12/19/2021    ECHOCARDIOGRAM REPORT   Patient Name:   Kenneth Benitez Date of Exam: 12/19/2021 Medical Rec #:  [id number]         Height:       65.0 in Accession #:    [id number]        Weight:       152.1 lb Date of Birth:  09/14/1945        BSA:          1.761 m Patient Age:    25 years          BP:           164/97 mmHg Patient Gender: M                 HR:           78 bpm. Exam Location:  Inpatient Procedure: 2D Echo Indications:    abnormal ecg  History:        Patient has no prior history of Echocardiogram examinations.  Risk Factors:Current Smoker.  Sonographer:    Johny Chess RDCS Referring Phys: [id number] [place]  1. Left ventricular ejection fraction, by estimation, is 55 to 60%. The left ventricle has normal function. Left ventricular endocardial border not optimally defined to evaluate regional wall motion. Left ventricular diastolic parameters are consistent with Grade I diastolic dysfunction (impaired relaxation).  2. Right ventricular systolic function is normal. The right ventricular size is normal. Tricuspid regurgitation signal is inadequate for assessing PA pressure.  3. The mitral valve is normal in structure. No evidence of mitral valve regurgitation. No evidence of mitral stenosis.  4. The aortic valve has an indeterminant number of cusps. Aortic valve regurgitation is moderate.  5. The inferior vena cava is normal in size with greater than 50% respiratory variability, suggesting right atrial pressure of 3 mmHg. FINDINGS  Left Ventricle: Left ventricular ejection fraction, by estimation, is 55 to 60%. The left ventricle has  normal function. Left ventricular endocardial border not optimally defined to evaluate regional wall motion. The left ventricular internal cavity size was normal in size. There is no left ventricular hypertrophy. Left ventricular diastolic parameters are consistent with Grade I diastolic dysfunction (impaired relaxation). Normal left ventricular filling pressure. Right Ventricle: The right ventricular size is normal. Right vetricular wall thickness was not well visualized. Right ventricular systolic function is normal. Tricuspid regurgitation signal is inadequate for assessing PA pressure. Left Atrium: Left atrial size was normal in size. Right Atrium: Right atrial size was normal in size. Pericardium: There is no evidence of pericardial effusion. Mitral Valve: The mitral valve is normal in structure. No evidence of mitral valve regurgitation. No evidence of mitral valve stenosis. Tricuspid Valve: The tricuspid valve is normal in structure. Tricuspid valve regurgitation is not demonstrated. No evidence of tricuspid stenosis. Aortic Valve: The aortic valve has an indeterminant number of cusps. Aortic valve regurgitation is moderate. Aortic regurgitation PHT measures 492 msec. Pulmonic Valve: The pulmonic valve was not well visualized. Pulmonic valve regurgitation is not visualized. No evidence of pulmonic stenosis. Aorta: The aortic root is normal in size and structure. Venous: The inferior vena cava is normal in size with greater than 50% respiratory variability, suggesting right atrial pressure of 3 mmHg. IAS/Shunts: No atrial level shunt detected by color flow Doppler.  LEFT VENTRICLE PLAX 2D LVIDd:         3.90 cm   Diastology LVIDs:         2.70 cm   LV e' medial:    8.38 cm/s LV PW:         1.00 cm   LV E/e' medial:  7.3 LV IVS:        0.90 cm   LV e' lateral:   10.10 cm/s LVOT diam:     2.10 cm   LV E/e' lateral: 6.0 LV SV:         55 LV SV Index:   31 LVOT Area:     3.46 cm  RIGHT VENTRICLE             IVC  RV S prime:     13.50 cm/s  IVC diam: 1.90 cm TAPSE (M-mode): 2.0 cm LEFT ATRIUM             Index        RIGHT ATRIUM          Index LA diam:        2.90 cm 1.65 cm/m   RA Area:  9.36 cm LA Vol (A2C):   31.3 ml 17.77 ml/m  RA Volume:   18.70 ml 10.62 ml/m LA Vol (A4C):   33.4 ml 18.97 ml/m LA Biplane Vol: 34.9 ml 19.82 ml/m  AORTIC VALVE LVOT Vmax:   99.30 cm/s LVOT Vmean:  61.900 cm/s LVOT VTI:    0.158 m AI PHT:      492 msec  AORTA Ao Root diam: 3.10 cm MITRAL VALVE MV Area (PHT): 4.06 cm    SHUNTS MV Decel Time: 187 msec    Systemic VTI:  0.16 m MV E velocity: 60.80 cm/s  Systemic Diam: 2.10 cm MV A velocity: 82.40 cm/s MV E/A ratio:  0.74 Carlyle Dolly MD Electronically signed by Carlyle Dolly MD Signature Date/Time: 12/19/2021/11:29:08 AM    Final    CT Angio Chest PE W and/or Wo Contrast  Result Date: 12/18/2021 CLINICAL DATA:  High probability pulmonary embolism; decreased sensation in lower extremities; decreasing cognitive function EXAM: CT ANGIOGRAPHY CHEST WITH CONTRAST TECHNIQUE: Multidetector CT imaging of the chest was performed using the standard protocol during bolus administration of intravenous contrast. Multiplanar CT image reconstructions and MIPs were obtained to evaluate the vascular anatomy. RADIATION DOSE REDUCTION: This exam was performed according to the departmental dose-optimization program which includes automated exposure control, adjustment of the mA and/or kV according to patient size and/or use of iterative reconstruction technique. CONTRAST:  167mL OMNIPAQUE IOHEXOL 350 MG/ML SOLN COMPARISON:  None Available. FINDINGS: Cardiovascular: Satisfactory opacification of the pulmonary arteries to the segmental level. No evidence of pulmonary embolism. Normal heart size. No pericardial effusion. Mild aortic atherosclerosis. Mediastinum/Nodes: Patulous esophagus containing air-fluid level up to the upper-mid esophagus. No thoracic adenopathy by size. Unremarkable trachea.  Lungs/Pleura: Centrilobular emphysema. Numerous bilateral centrilobular micro nodules and tree in bud opacities with more confluence ground-glass opacities in the posterior right lower lobe. Mild diffuse bronchial wall thickening. Upper Abdomen: 1.0 cm short axis lymph node in the upper abdomen is favored reactive but technically indeterminate. No acute abnormality in the visualized abdomen. Musculoskeletal: No chest wall abnormality. No acute osseous findings. Review of the MIP images confirms the above findings. IMPRESSION: 1. Negative for acute pulmonary embolism. 2. Pulmonary findings consistent with small airway infection/inflammation. Right lower lobe opacities consistent with pneumonia/pneumonitis. Given the patulous esophagus containing layering fluid this may be related to aspiration. 3. Aortic Atherosclerosis (ICD10-I70.0) and Emphysema (ICD10-J43.9). Electronically Signed   By: Placido Sou M.D.   On: 12/18/2021 19:21   CT Head Wo Contrast  Result Date: 12/18/2021 CLINICAL DATA:  Altered mental status EXAM: CT HEAD WITHOUT CONTRAST TECHNIQUE: Contiguous axial images were obtained from the base of the skull through the vertex without intravenous contrast. RADIATION DOSE REDUCTION: This exam was performed according to the departmental dose-optimization program which includes automated exposure control, adjustment of the mA and/or kV according to patient size and/or use of iterative reconstruction technique. COMPARISON:  05/22/2021 FINDINGS: Brain: No acute intracranial findings are seen in noncontrast CT brain. There are no signs of bleeding within the cranium. Cortical sulci are prominent. There is decreased density in periventricular and subcortical white matter. Vascular: Unremarkable. Skull: Unremarkable. Sinuses/Orbits: There is opacification of left maxillary sinus. There is medial bulging of medial wall of left maxillary antrum. These findings appear stable. Other: None. IMPRESSION: No acute  intracranial findings are seen in noncontrast CT brain. Atrophy. Small-vessel disease. Opacification of left maxillary sinus and bulging of medial wall of left maxillary antrum appear stable. Electronically Signed   By: Elmer Picker  M.D.   On: 12/18/2021 19:10   DG Chest 2 View  Result Date: 12/18/2021 CLINICAL DATA:  Cough EXAM: CHEST - 2 VIEW COMPARISON:  07/23/2014 FINDINGS: Cardiac size is within normal limits. There are no signs of alveolar pulmonary edema. There is prominence of right hilum. Increased markings are seen in medial right lower lung field. Rest of the lung fields are clear. Left hemidiaphragm is slightly elevated. There is no pleural effusion or pneumothorax. IMPRESSION: Increased markings are seen in medial right lower lung fields suggesting atelectasis/pneumonia. Right hilum is more prominent on the left which may be due to adjacent infiltrate or enlarged lymph nodes. Short-term follow-up chest radiographs along with CT if warranted should be considered to rule out any underlying neoplastic process. Electronically Signed   By: Elmer Picker M.D.   On: 12/18/2021 13:24   (Echo, Carotid, EGD, Colonoscopy, ERCP)    Subjective: Patient is resting in bed no complaints events overnight noted repeat CT this morning on the day of discharge shows atrophy no acute findings.  Discharge Exam: Vitals:   12/22/21 0400 12/22/21 0909  BP: 127/80 (!) 153/107  Pulse: 72   Resp: 17   Temp:    SpO2: 100%    Vitals:   12/22/21 0300 12/22/21 0313 12/22/21 0400 12/22/21 0909  BP: (!) 143/79  127/80 (!) 153/107  Pulse: 76  72   Resp: 20  17   Temp: 98.2 F (36.8 C)     TempSrc: Oral     SpO2: 96%  100%   Weight:  65 kg    Height:        General: Pt is alert, awake, not in acute distress Cardiovascular: RRR, S1/S2 +, no rubs, no gallops Respiratory: CTA bilaterally, no wheezing, no rhonchi Abdominal: Soft, NT, ND, bowel sounds + Extremities: no edema, no  cyanosis    The results of significant diagnostics from this hospitalization (including imaging, microbiology, ancillary and laboratory) are listed below for reference.     Microbiology: Recent Results (from the past 240 hour(s))  Urine Culture     Status: None   Collection Time: 12/18/21 12:51 PM   Specimen: Urine, Clean Catch  Result Value Ref Range Status   Specimen Description URINE, CLEAN CATCH  Final   Special Requests NONE  Final   Culture   Final    NO GROWTH Performed at [facility], [address], [place], [place] [address]    Report Status 12/19/2021 FINAL  Final  Resp Panel by RT-PCR (Flu A&B, Covid) Anterior Nasal Swab     Status: None   Collection Time: 12/18/21 12:53 PM   Specimen: Anterior Nasal Swab  Result Value Ref Range Status   SARS Coronavirus 2 by RT PCR NEGATIVE NEGATIVE Final    Comment: (NOTE) SARS-CoV-2 target nucleic acids are NOT DETECTED.  The SARS-CoV-2 RNA is generally detectable in upper respiratory specimens during the acute phase of infection. The lowest concentration of SARS-CoV-2 viral copies this assay can detect is 138 copies/mL. A negative result does not preclude SARS-Cov-2 infection and should not be used as the sole basis for treatment or other patient management decisions. A negative result may occur with  improper specimen collection/handling, submission of specimen other than nasopharyngeal swab, presence of viral mutation(s) within the areas targeted by this assay, and inadequate number of viral copies(<138 copies/mL). A negative result must be combined with clinical observations, patient history, and epidemiological information. The expected result is Negative.  Fact Sheet for Patients:  EntrepreneurPulse.com.au  Fact Sheet for Healthcare Providers:  IncredibleEmployment.be  This test is no t yet approved or cleared by the [place] FDA and  has been authorized for detection  and/or diagnosis of SARS-CoV-2 by FDA under an Emergency Use Authorization (EUA). This EUA will remain  in effect (meaning this test can be used) for the duration of the COVID-19 declaration under Section 564(b)(1) of the Act, 21 U.S.C.section 360bbb-3(b)(1), unless the authorization is terminated  or revoked sooner.       Influenza A by PCR NEGATIVE NEGATIVE Final   Influenza B by PCR NEGATIVE NEGATIVE Final    Comment: (NOTE) The Xpert Xpress SARS-CoV-2/FLU/RSV plus assay is intended as an aid in the diagnosis of influenza from Nasopharyngeal swab specimens and should not be used as a sole basis for treatment. Nasal washings and aspirates are unacceptable for Xpert Xpress SARS-CoV-2/FLU/RSV testing.  Fact Sheet for Patients: EntrepreneurPulse.com.au  Fact Sheet for Healthcare Providers: IncredibleEmployment.be  This test is not yet approved or cleared by the [place] FDA and has been authorized for detection and/or diagnosis of SARS-CoV-2 by FDA under an Emergency Use Authorization (EUA). This EUA will remain in effect (meaning this test can be used) for the duration of the COVID-19 declaration under Section 564(b)(1) of the Act, 21 U.S.C. section 360bbb-3(b)(1), unless the authorization is terminated or revoked.  Performed at [facility], [place] [address], [place], [place] [address]      Labs: BNP (last 3 results) Recent Labs    12/18/21 1803  BNP [id number]   Basic Metabolic Panel: Recent Labs  Lab 12/18/21 1251 12/19/21 1520 12/20/21 0226 12/21/21 1337  NA 143 141 140 138  K 3.0* 3.3* 4.7 4.1  CL 105 103 102 103  CO2 27 28 27 28   GLUCOSE 79 103* 81 91  BUN 12 10 19 19   CREATININE 1.42* 1.36* 1.41* 1.46*  CALCIUM 9.9 9.0 9.1 9.1  MG 2.0 1.8  --   --   PHOS  --  2.8  --   --    Liver Function Tests: Recent Labs  Lab 12/18/21 1251 12/19/21 1520 12/21/21 1337  AST 24 22 18   ALT 23 18 15   ALKPHOS 78 55 57   BILITOT 0.4 0.7 0.5  PROT 7.9 6.2* 6.4*  ALBUMIN 4.1 3.1* 3.0*   No results for input(s): "LIPASE", "AMYLASE" in the last 168 hours. Recent Labs  Lab 12/18/21 1803  AMMONIA 19   CBC: Recent Labs  Lab 12/18/21 1251 12/19/21 1520 12/21/21 1337  WBC 12.9* 10.4 9.0  NEUTROABS 9.2* 7.1  --   HGB 13.7 12.1* 12.9*  HCT 43.2 36.0* 38.7*  MCV 82.6 79.5* 79.5*  PLT 246 212 254   Cardiac Enzymes: No results for input(s): "CKTOTAL", "CKMB", "CKMBINDEX", "TROPONINI" in the last 168 hours. BNP: Invalid input(s): "POCBNP" CBG: Recent Labs  Lab 12/18/21 1701 12/21/21 1901  GLUCAP 78 134*   D-Dimer No results for input(s): "DDIMER" in the last 72 hours. Hgb A1c No results for input(s): "HGBA1C" in the last 72 hours. Lipid Profile No results for input(s): "CHOL", "HDL", "LDLCALC", "TRIG", "CHOLHDL", "LDLDIRECT" in the last 72 hours. Thyroid function studies Recent Labs    12/19/21 1520  TSH 1.703   Anemia work up No results for input(s): "VITAMINB12", "FOLATE", "FERRITIN", "TIBC", "IRON", "RETICCTPCT" in the last 72 hours. Urinalysis    Component Value Date/Time   COLORURINE YELLOW 12/18/2021 1806   APPEARANCEUR CLEAR 12/18/2021 1806   LABSPEC 1.024 12/18/2021 1806  PHURINE 5.0 12/18/2021 1806   GLUCOSEU NEGATIVE 12/18/2021 1806   HGBUR NEGATIVE 12/18/2021 1806   BILIRUBINUR NEGATIVE 12/18/2021 1806   KETONESUR 5 (A) 12/18/2021 1806   PROTEINUR 30 (A) 12/18/2021 1806   NITRITE NEGATIVE 12/18/2021 1806   LEUKOCYTESUR SMALL (A) 12/18/2021 1806   Sepsis Labs Recent Labs  Lab 12/18/21 1251 12/19/21 1520 12/21/21 1337  WBC 12.9* 10.4 9.0   Microbiology Recent Results (from the past 240 hour(s))  Urine Culture     Status: None   Collection Time: 12/18/21 12:51 PM   Specimen: Urine, Clean Catch  Result Value Ref Range Status   Specimen Description URINE, CLEAN CATCH  Final   Special Requests NONE  Final   Culture   Final    NO GROWTH Performed at [facility], [address] [place], [place] [address]    Report Status 12/19/2021 FINAL  Final  Resp Panel by RT-PCR (Flu A&B, Covid) Anterior Nasal Swab     Status: None   Collection Time: 12/18/21 12:53 PM   Specimen: Anterior Nasal Swab  Result Value Ref Range Status   SARS Coronavirus 2 by RT PCR NEGATIVE NEGATIVE Final    Comment: (NOTE) SARS-CoV-2 target nucleic acids are NOT DETECTED.  The SARS-CoV-2 RNA is generally detectable in upper respiratory specimens during the acute phase of infection. The lowest concentration of SARS-CoV-2 viral copies this assay can detect is 138 copies/mL. A negative result does not preclude SARS-Cov-2 infection and should not be used as the sole basis for treatment or other patient management decisions. A negative result may occur with  improper specimen collection/handling, submission of specimen other than nasopharyngeal swab, presence of viral mutation(s) within the areas targeted by this assay, and inadequate number of viral copies(<138 copies/mL). A negative result must be combined with clinical observations, patient history, and epidemiological information. The expected result is Negative.  Fact Sheet for Patients:  BloggerCourse.com  Fact Sheet for Healthcare Providers:  SeriousBroker.it  This test is no t yet approved or cleared by the [place] FDA and  has been authorized for detection and/or diagnosis of SARS-CoV-2 by FDA under an Emergency Use Authorization (EUA). This EUA will remain  in effect (meaning this test can be used) for the duration of the COVID-19 declaration under Section 564(b)(1) of the Act, 21 U.S.C.section 360bbb-3(b)(1), unless the authorization is terminated  or revoked sooner.       Influenza A by PCR NEGATIVE NEGATIVE Final   Influenza B by PCR NEGATIVE NEGATIVE Final    Comment: (NOTE) The Xpert Xpress SARS-CoV-2/FLU/RSV plus assay is intended as an  aid in the diagnosis of influenza from Nasopharyngeal swab specimens and should not be used as a sole basis for treatment. Nasal washings and aspirates are unacceptable for Xpert Xpress SARS-CoV-2/FLU/RSV testing.  Fact Sheet for Patients: BloggerCourse.com  Fact Sheet for Healthcare Providers: SeriousBroker.it  This test is not yet approved or cleared by the [place] FDA and has been authorized for detection and/or diagnosis of SARS-CoV-2 by FDA under an Emergency Use Authorization (EUA). This EUA will remain in effect (meaning this test can be used) for the duration of the COVID-19 declaration under Section 564(b)(1) of the Act, 21 U.S.C. section 360bbb-3(b)(1), unless the authorization is terminated or revoked.  Performed at [facility], [address] [place], [place] [address]      Time coordinating discharge:  38 minutes  SIGNED:   Alwyn Ren, MD  [facility] 12/22/2021, 12:01  PM

## 2021-12-22 NOTE — Care Management Important Message (Signed)
Important Message  Patient Details  Name: Kenneth Benitez MRN: [id number] Date of Birth: Dec 21, 1945   Medicare Important Message Given:  Yes     [name] [name] 12/22/2021, 3:40 PM
